# Patient Record
Sex: Female | Born: 1975 | Race: White | Hispanic: No | Smoking: Never smoker
Health system: Southern US, Community
[De-identification: ages and names within clinical notes are randomized; demographics above are authoritative.]

## PROBLEM LIST (undated history)

## (undated) DIAGNOSIS — Z8041 Family history of malignant neoplasm of ovary: Secondary | ICD-10-CM

## (undated) DIAGNOSIS — E7800 Pure hypercholesterolemia, unspecified: Secondary | ICD-10-CM

## (undated) DIAGNOSIS — R569 Unspecified convulsions: Secondary | ICD-10-CM

## (undated) DIAGNOSIS — I82409 Acute embolism and thrombosis of unspecified deep veins of unspecified lower extremity: Secondary | ICD-10-CM

## (undated) DIAGNOSIS — I10 Essential (primary) hypertension: Secondary | ICD-10-CM

## (undated) DIAGNOSIS — R519 Headache, unspecified: Secondary | ICD-10-CM

## (undated) DIAGNOSIS — Z8489 Family history of other specified conditions: Secondary | ICD-10-CM

## (undated) DIAGNOSIS — R51 Headache: Secondary | ICD-10-CM

## (undated) DIAGNOSIS — Z87442 Personal history of urinary calculi: Secondary | ICD-10-CM

## (undated) DIAGNOSIS — F909 Attention-deficit hyperactivity disorder, unspecified type: Secondary | ICD-10-CM

## (undated) DIAGNOSIS — K219 Gastro-esophageal reflux disease without esophagitis: Secondary | ICD-10-CM

## (undated) HISTORY — PX: LEG SURGERY: SHX1003

## (undated) HISTORY — PX: TONSILLECTOMY: SUR1361

---

## 2002-01-29 HISTORY — PX: CHOLECYSTECTOMY: SHX55

## 2008-12-15 ENCOUNTER — Ambulatory Visit: Payer: Self-pay

## 2009-08-08 ENCOUNTER — Ambulatory Visit: Payer: Self-pay | Admitting: Unknown Physician Specialty

## 2011-11-12 ENCOUNTER — Ambulatory Visit: Payer: Self-pay | Admitting: Internal Medicine

## 2013-01-29 HISTORY — PX: DILATION AND CURETTAGE OF UTERUS: SHX78

## 2013-09-17 ENCOUNTER — Ambulatory Visit: Payer: Self-pay | Admitting: Obstetrics and Gynecology

## 2013-09-17 LAB — HEMOGLOBIN: HGB: 14.2 g/dL (ref 12.0–16.0)

## 2013-09-22 ENCOUNTER — Ambulatory Visit: Payer: Self-pay | Admitting: Obstetrics and Gynecology

## 2013-09-24 LAB — PATHOLOGY REPORT

## 2014-01-29 HISTORY — PX: COLPOSCOPY: SHX161

## 2014-03-30 HISTORY — PX: COLPOSCOPY: SHX161

## 2014-05-22 NOTE — Op Note (Signed)
PATIENT NAME:  Ashley Rose, PARO MR#:  [id number] DATE OF BIRTH:  Dec 01, 1975  DATE OF PROCEDURE:  09/22/2013   PREOPERATIVE DIAGNOSIS: Abnormal uterine bleeding and chronic pelvic pain.   POSTOPERATIVE DIAGNOSIS: Abnormal uterine bleeding and chronic pelvic pain.   OPERATIONS PERFORMED: Hysteroscopy, dilatation and curettage and diagnostic laparoscopy.   ANESTHESIA USED: General.    PRIMARY SURGEON: Stoney Bang. Georgianne Fick, MD  ESTIMATED BLOOD LOSS: Minimal.   OPERATIVE FLUIDS: 700 mL of crystalloid.   URINE OUTPUT: 30 mL of clear urine.   COMPLICATIONS: None.   FINDINGS: Normal intra-abdominal anatomy and normal uterine cavity.   SPECIMENS REMOVED: Endometrial curettings.   PATIENT CONDITION FOLLOWING THE PROCEDURE: Stable.   PROCEDURE IN DETAIL: Risks, benefits, and alternatives of the procedure were discussed with the patient prior to proceeding to the operating room. The patient had had prior normal imaging of her GYN anatomy via transvaginal ultrasound. One other thing  considered in the differential was possible endometriosis, given her pain symptoms. As far as her bleeding goes, she did have a brief cessation on bleeding on Mirena IUD, but opted for removal secondary to pain, and has had continued bleeding over the course of a year on Depo-Provera.   The patient was taken to the operating room, where she was placed under general endotracheal anesthesia. She was positioned in the dorsal lithotomy position utilizing Allen stirrups, prepped and draped in the usual sterile fashion. A timeout was performed. Attention was turned to the patient's pelvis. An operative speculum was used to visualize the anterior lip of the cervix, which was reached was grasped with a single-tooth tenaculum. A Hulka tenaculum was then placed to allow manipulation of the uterus. The single-tooth tenaculum and sterile speculum were removed. The patient's bladder was straight catheterized prior to placement of  the tenaculum, yielding 30 mL of clear urine.   Attention was then turned to the patient's abdomen. The umbilicus was infiltrated with 0.5% Sensorcaine. A stab incision was made at the umbilicus and a 5 mm XL trocar was used to gain direct entry into the peritoneal cavity under visualization. A pneumoperitoneum was establish. A second left 5 mm assistant port was then placed in the left lower quadrant under visualization. Inspection of the abdomen and pelvis revealed normal findings. The ovaries, uterus, and tubes were grossly normal. The anterior and posterior cul-de-sac were free of endometriotic implants. There was no evidence of inguinal hernia. The appendix was retrocecal and was unable to be visualized. The liver edge was normal. The ureters were traced throughout their course and appeared normal in nondilated. At this point,  pneumoperitoneum was evacuated. The trocars were removed. Trocar sites were dressed with Dermabond.   Attention was turned to the patient's pelvis. The Hulka tenaculum was removed. The single-tooth tenaculum was replaced on the cervix, and the cervix was sequentially dilated using Pratt dilators. A hysteroscope was then advanced into the uterine cavity, revealing bilateral tubal ostia to be grossly normal and normal uterine contour with normal appearing endometrium. The hysteroscope was removed. The sharp curettage was performed. The single-tooth tenaculum was removed. Tenaculum sites were noted to be hemostatic.   Sponge, needle, and instrument counts were correct x 2.   The patient tolerated the procedure well and was taken to the recovery room in stable condition.     ____________________________ Stoney Bang. Georgianne Fick, MD ams:MT D: 09/22/2013 17:14:43 ET T: 09/23/2013 05:48:00 ET JOB#: [id number]  cc: Stoney Bang. Georgianne Fick, MD, <Dictator> Dorthula Nettles MD ELECTRONICALLY SIGNED 10/08/2013 16:01

## 2014-07-10 ENCOUNTER — Emergency Department: Payer: Medicaid Other

## 2014-07-10 ENCOUNTER — Encounter: Payer: Self-pay | Admitting: Emergency Medicine

## 2014-07-10 ENCOUNTER — Emergency Department
Admission: EM | Admit: 2014-07-10 | Discharge: 2014-07-10 | Disposition: A | Discharge status: Home / Self Care | Payer: Medicaid Other | Attending: Emergency Medicine | Admitting: Emergency Medicine

## 2014-07-10 DIAGNOSIS — S40021A Contusion of right upper arm, initial encounter: Secondary | ICD-10-CM | POA: Diagnosis not present

## 2014-07-10 DIAGNOSIS — S8012XA Contusion of left lower leg, initial encounter: Secondary | ICD-10-CM | POA: Diagnosis not present

## 2014-07-10 DIAGNOSIS — S40022A Contusion of left upper arm, initial encounter: Secondary | ICD-10-CM | POA: Insufficient documentation

## 2014-07-10 DIAGNOSIS — Y9389 Activity, other specified: Secondary | ICD-10-CM | POA: Insufficient documentation

## 2014-07-10 DIAGNOSIS — Z79899 Other long term (current) drug therapy: Secondary | ICD-10-CM | POA: Diagnosis not present

## 2014-07-10 DIAGNOSIS — S0993XA Unspecified injury of face, initial encounter: Secondary | ICD-10-CM | POA: Diagnosis present

## 2014-07-10 DIAGNOSIS — Y9289 Other specified places as the place of occurrence of the external cause: Secondary | ICD-10-CM | POA: Insufficient documentation

## 2014-07-10 DIAGNOSIS — S0083XA Contusion of other part of head, initial encounter: Secondary | ICD-10-CM | POA: Diagnosis not present

## 2014-07-10 DIAGNOSIS — S8011XA Contusion of right lower leg, initial encounter: Secondary | ICD-10-CM | POA: Diagnosis not present

## 2014-07-10 DIAGNOSIS — S7011XA Contusion of right thigh, initial encounter: Secondary | ICD-10-CM | POA: Diagnosis not present

## 2014-07-10 DIAGNOSIS — Z87891 Personal history of nicotine dependence: Secondary | ICD-10-CM | POA: Insufficient documentation

## 2014-07-10 DIAGNOSIS — Y998 Other external cause status: Secondary | ICD-10-CM | POA: Insufficient documentation

## 2014-07-10 HISTORY — DX: Acute embolism and thrombosis of unspecified deep veins of unspecified lower extremity: I82.409

## 2014-07-10 HISTORY — DX: Attention-deficit hyperactivity disorder, unspecified type: F90.9

## 2014-07-10 HISTORY — DX: Pure hypercholesterolemia, unspecified: E78.00

## 2014-07-10 HISTORY — DX: Gastro-esophageal reflux disease without esophagitis: K21.9

## 2014-07-10 MED ORDER — HYDROCODONE-ACETAMINOPHEN 5-325 MG PO TABS
1.0000 | ORAL_TABLET | Freq: Once | ORAL | Status: AC
  Administered 2014-07-10: 1 via ORAL

## 2014-07-10 MED ORDER — HYDROCODONE-ACETAMINOPHEN 5-325 MG PO TABS
ORAL_TABLET | ORAL | Status: AC
  Filled 2014-07-10: qty 1

## 2014-07-10 MED ORDER — NAPROXEN 500 MG PO TABS: 500.0000 mg | ORAL_TABLET | Freq: Two times a day (BID) | ORAL | Status: DC
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
## 2014-07-10 NOTE — Discharge Instructions (Signed)
Assault, General °Assault includes any behavior, whether intentional or reckless, which results in bodily injury to another person and/or damage to property. Included in this would be any behavior, intentional or reckless, that by its nature would be understood (interpreted) by a reasonable person as intent to harm another person or to damage his/her property. Threats may be oral or written. They may be communicated through regular mail, computer, fax, or phone. These threats may be direct or implied. °FORMS OF ASSAULT INCLUDE: °· Physically assaulting a person. This includes physical threats to inflict physical harm as well as: °¨ Slapping. °¨ Hitting. °¨ Poking. °¨ Kicking. °¨ Punching. °¨ Pushing. °· Arson. °· Sabotage. °· Equipment vandalism. °· Damaging or destroying property. °· Throwing or hitting objects. °· Displaying a weapon or an object that appears to be a weapon in a threatening manner. °¨ Carrying a firearm of any kind. °¨ Using a weapon to harm someone. °· Using greater physical size/strength to intimidate another. °¨ Making intimidating or threatening gestures. °¨ Bullying. °¨ Hazing. °· Intimidating, threatening, hostile, or abusive language directed toward another person. °¨ It communicates the intention to engage in violence against that person. And it leads a reasonable person to expect that violent behavior may occur. °· Stalking another person. °IF IT HAPPENS AGAIN: °· Immediately call for emergency help (911 in [place] °· If someone poses clear and immediate danger to you, seek legal authorities to have a protective or restraining order put in place. °· Less threatening assaults can at least be reported to authorities. °STEPS TO TAKE IF A SEXUAL ASSAULT HAS HAPPENED °· Go to an area of safety. This may include a shelter or staying with a friend. Stay away from the area where you have been attacked. A large percentage of sexual assaults are caused by a friend, relative or associate. °· If  medications were given by your caregiver, take them as directed for the full length of time prescribed. °· Only take over-the-counter or prescription medicines for pain, discomfort, or fever as directed by your caregiver. °· If you have come in contact with a sexual disease, find out if you are to be tested again. If your caregiver is concerned about the HIV/AIDS virus, he/she may require you to have continued testing for several months. °· For the protection of your privacy, test results can not be given over the phone. Make sure you receive the results of your test. If your test results are not back during your visit, make an appointment with your caregiver to find out the results. Do not assume everything is normal if you have not heard from your caregiver or the medical facility. It is important for you to follow up on all of your test results. °· File appropriate papers with authorities. This is important in all assaults, even if it has occurred in a family or by a friend. °SEEK MEDICAL CARE IF: °· You have new problems because of your injuries. °· You have problems that may be because of the medicine you are taking, such as: °¨ Rash. °¨ Itching. °¨ Swelling. °¨ Trouble breathing. °· You develop belly (abdominal) pain, feel sick to your stomach (nausea) or are vomiting. °· You begin to run a temperature. °· You need supportive care or referral to a rape crisis center. These are centers with trained personnel who can help you get through this ordeal. °SEEK IMMEDIATE MEDICAL CARE IF: °· You are afraid of being threatened, beaten, or abused. In [place] call 911. °· You   receive new injuries related to abuse.  You develop severe pain in any area injured in the assault or have any change in your condition that concerns you.  You faint or lose consciousness.  You develop chest pain or shortness of breath. Document Released: 01/15/2005 Document Revised: 04/09/2011 Document Reviewed: 09/03/2007 [facility] Patient  Information 2015 [place], [place]. This information is not intended to replace advice given to you by your health care provider. Make sure you discuss any questions you have with your health care provider.  Contusion A contusion is a deep bruise. Contusions happen when an injury causes bleeding under the skin. Signs of bruising include pain, puffiness (swelling), and discolored skin. The contusion may turn blue, purple, or yellow. HOME CARE   Put ice on the injured area.  Put ice in a plastic bag.  Place a towel between your skin and the bag.  Leave the ice on for 15-20 minutes, 03-04 times a day.  Only take medicine as told by your doctor.  Rest the injured area.  If possible, raise (elevate) the injured area to lessen puffiness. GET HELP RIGHT AWAY IF:   You have more bruising or puffiness.  You have pain that is getting worse.  Your puffiness or pain is not helped by medicine. MAKE SURE YOU:   Understand these instructions.  Will watch your condition.  Will get help right away if you are not doing well or get worse. Document Released: 07/04/2007 Document Revised: 04/09/2011 Document Reviewed: 11/20/2010 [facility] Patient Information 2015 [place], [place]. This information is not intended to replace advice given to you by your health care provider. Make sure you discuss any questions you have with your health care provider.
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
## 2014-11-04 ENCOUNTER — Ambulatory Visit: Payer: Medicaid Other | Attending: Neurosurgery | Admitting: Physical Therapy

## 2014-11-04 ENCOUNTER — Encounter: Payer: Self-pay | Admitting: Physical Therapy

## 2014-11-04 DIAGNOSIS — M2569 Stiffness of other specified joint, not elsewhere classified: Secondary | ICD-10-CM

## 2014-11-04 DIAGNOSIS — R29898 Other symptoms and signs involving the musculoskeletal system: Secondary | ICD-10-CM | POA: Diagnosis present

## 2014-11-04 DIAGNOSIS — M545 Low back pain, unspecified: Secondary | ICD-10-CM

## 2014-11-04 DIAGNOSIS — R262 Difficulty in walking, not elsewhere classified: Secondary | ICD-10-CM | POA: Diagnosis present

## 2014-11-04 DIAGNOSIS — M2560 Stiffness of unspecified joint, not elsewhere classified: Secondary | ICD-10-CM
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
## 2015-02-28 ENCOUNTER — Emergency Department
Admission: EM | Admit: 2015-02-28 | Discharge: 2015-02-28 | Disposition: A | Discharge status: Home / Self Care | Payer: Medicaid Other | Attending: Emergency Medicine | Admitting: Emergency Medicine

## 2015-02-28 ENCOUNTER — Encounter: Payer: Self-pay | Admitting: Emergency Medicine

## 2015-02-28 DIAGNOSIS — Z79899 Other long term (current) drug therapy: Secondary | ICD-10-CM | POA: Insufficient documentation

## 2015-02-28 DIAGNOSIS — H6092 Unspecified otitis externa, left ear: Secondary | ICD-10-CM | POA: Insufficient documentation

## 2015-02-28 DIAGNOSIS — Z791 Long term (current) use of non-steroidal anti-inflammatories (NSAID): Secondary | ICD-10-CM | POA: Diagnosis not present

## 2015-02-28 DIAGNOSIS — H9202 Otalgia, left ear: Secondary | ICD-10-CM | POA: Diagnosis present

## 2015-02-28 MED ORDER — IBUPROFEN 800 MG PO TABS: 800.0000 mg | ORAL_TABLET | Freq: Three times a day (TID) | ORAL | Status: DC | PRN

## 2015-02-28 MED ORDER — OFLOXACIN 0.3 % OP SOLN: 4.0000 [drp] | Freq: Two times a day (BID) | OPHTHALMIC | Status: DC

## 2015-02-28 NOTE — Discharge Instructions (Signed)
Ear Drops, Adult You have been diagnosed with a condition requiring you to put drops of medicine into your outer ear. HOME CARE INSTRUCTIONS   Put drops in the affected ear as instructed. After putting the drops in, you will need to lie down with the affected ear facing up for ten minutes so the drops will remain in the ear canal and run down and fill the canal. Continue using the ear drops for as long as directed by your health care provider.  Prior to getting up, put a cotton ball gently in your ear canal. Leave enough of the cotton ball out so it can be easily removed. Do not attempt to push this down into the canal with a cotton-tipped swab or other instrument.  Do not irrigate or wash out your ears if you have had a perforated eardrum or mastoid surgery, or unless instructed to do so by your health care provider.  Keep appointments with your health care provider as instructed.  Finish all medicine, or use for the length of time prescribed by your health care provider. Continue the drops even if your problem seems to be doing well after a couple days, or continue as instructed. SEEK MEDICAL CARE IF:  You become worse or develop increasing pain.  You notice any unusual drainage from your ear (particularly if the drainage has a bad smell).  You develop hearing difficulties.  You experience a serious form of dizziness in which you feel as if the room is spinning, and you feel nauseated (vertigo).  The outside of your ear becomes red or swollen or both. This may be a sign of an allergic reaction. MAKE SURE YOU:   Understand these instructions.  Will watch your condition.  Will get help right away if you are not doing well or get worse.   This information is not intended to replace advice given to you by your health care provider. Make sure you discuss any questions you have with your health care provider.   Document Released: 01/09/2001 Document Revised: 02/05/2014 Document  Reviewed: 08/12/2012 Elsevier Interactive Patient Education 2016 Elsevier Inc.  Otitis Externa Otitis externa is a germ infection in the outer ear. The outer ear is the area from the eardrum to the outside of the ear. Otitis externa is sometimes called "swimmer's ear." HOME CARE  Put drops in the ear as told by your doctor.  Only take medicine as told by your doctor.  If you have diabetes, your doctor may give you more directions. Follow your doctor's directions.  Keep all doctor visits as told. To avoid another infection:  Keep your ear dry. Use the corner of a towel to dry your ear after swimming or bathing.  Avoid scratching or putting things inside your ear.  Avoid swimming in lakes, dirty water, or pools that use a chemical called chlorine poorly.  You may use ear drops after swimming. Combine equal amounts of white vinegar and alcohol in a bottle. Put 3 or 4 drops in each ear. GET HELP IF:   You have a fever.  Your ear is still red, puffy (swollen), or painful after 3 days.  You still have yellowish-white fluid (pus) coming from the ear after 3 days.  Your redness, puffiness, or pain gets worse.  You have a really bad headache.  You have redness, puffiness, pain, or tenderness behind your ear. MAKE SURE YOU:   Understand these instructions.  Will watch your condition.  Will get help right away if you  are not doing well or get worse.   This information is not intended to replace advice given to you by your health care provider. Make sure you discuss any questions you have with your health care provider.   Document Released: 07/04/2007 Document Revised: 02/05/2014 Document Reviewed: 02/01/2011 Elsevier Interactive Patient Education [employer].

## 2015-02-28 NOTE — ED Notes (Signed)
Left ear pain for about 1 week  Tried to "pop" her ear w/o any relief   No fever

## 2015-02-28 NOTE — ED Provider Notes (Signed)
[facility] Emergency Department Provider Note  ____________________________________________  Time seen: Approximately 11:13 AM  I have reviewed the triage vital signs and the nursing notes.   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 female complains of left ear pain 2 weeks. Patient states that her ears. Had been filled with fluid and unable to pop the left ear. No relief with any over-the-counter medications.   Past Medical History  Diagnosis Date  . DVT (deep venous thrombosis) (HCC)     Right leg  . Hypercholesteremia   . GERD (gastroesophageal reflux disease)   . ADHD (attention deficit hyperactivity disorder)     There are no active problems to display for this patient.   History reviewed. No pertinent past surgical history.  Current Outpatient Rx  Name  Route  Sig  Dispense  Refill  . amphetamine-dextroamphetamine (ADDERALL) 15 MG tablet   Oral   Take 15 mg by mouth 2 (two) times daily.         . cetirizine (ZYRTEC) 10 MG tablet   Oral   Take 10 mg by mouth daily.         Marland Kitchen gemfibrozil (LOPID) 600 MG tablet   Oral   Take 600 mg by mouth 2 (two) times daily before a meal.         . ibuprofen (ADVIL,MOTRIN) 800 MG tablet   Oral   Take 1 tablet (800 mg total) by mouth every 8 (eight) hours as needed.   30 tablet   0   . naproxen (NAPROSYN) 500 MG tablet   Oral   Take 1 tablet (500 mg total) by mouth 2 (two) times daily with a meal.   20 tablet   2   . ofloxacin (OCUFLOX) 0.3 % ophthalmic solution   Left Ear   Place 4 drops into the left ear 2 (two) times daily.   5 mL   0   . omeprazole (PRILOSEC) 20 MG capsule   Oral   Take 20 mg by mouth daily.         . rivaroxaban (XARELTO) 20 MG TABS tablet   Oral   Take 20 mg by mouth daily with supper.           Allergies Review of patient's allergies indicates no known allergies.  History reviewed. No pertinent family  history.  Social History Social History  Substance Use Topics  . Smoking status: Never Smoker   . Smokeless tobacco: None  . Alcohol Use: Yes     Comment: occasionaly    Review of Systems Constitutional: No fever/chills Eyes: No visual changes. ENT: Positive left ear pain. Cardiovascular: Denies chest pain. Respiratory: Denies shortness of breath. Gastrointestinal: No abdominal pain.  No nausea, no vomiting.  No diarrhea.  No constipation. Genitourinary: Negative for dysuria. Musculoskeletal: Negative for back pain. Skin: Negative for rash. Neurological: Negative for headaches, focal weakness or numbness.  10-point ROS otherwise negative.  ____________________________________________   PHYSICAL EXAM:  VITAL SIGNS: ED Triage Vitals  Enc Vitals Group     BP 02/28/15 1025 144/73 mmHg     Pulse Rate 02/28/15 1025 106     Resp 02/28/15 1025 18     Temp 02/28/15 1025 98 F (36.7 C)     Temp Source 02/28/15 1025 Oral     SpO2 02/28/15 1025 97 %     Weight 02/28/15 1025 210 lb (95.255 kg)     Height 02/28/15  1025 5\' 3"  (1.6 m)     Head Cir --      Peak Flow --      Pain Score 02/28/15 1026 6     Pain Loc --      Pain Edu? --      Excl. in [place]? --     Constitutional: Alert and oriented. Well appearing and in no acute distress. Eyes: Conjunctivae are normal. PERRL. EOMI. Head: Atraumatic. Right TM unremarkable left TM obscured with greenish discharge. No tragal tenderness. Nose: No congestion/rhinnorhea. Mouth/Throat: Mucous membranes are moist.  Oropharynx non-erythematous. Neck: No stridor.   Cardiovascular: Normal rate, regular rhythm. Grossly normal heart sounds.  Good peripheral circulation. Respiratory: Normal respiratory effort.  No retractions. Lungs CTAB. Musculoskeletal: No lower extremity tenderness nor edema.  No joint effusions. Neurologic:  Normal speech and language. No gross focal neurologic deficits are appreciated. No gait instability. Skin:  Skin is  warm, dry and intact. No rash noted. Psychiatric: Mood and affect are normal. Speech and behavior are normal.  ____________________________________________   LABS (all labs ordered are listed, but only abnormal results are displayed)  Labs Reviewed - No data to display ____________________________________________   PROCEDURES  Procedure(s) performed: None  Critical Care performed: No  ____________________________________________   INITIAL IMPRESSION / ASSESSMENT AND PLAN / ED COURSE  Pertinent labs & imaging results that were available during my care of the patient were reviewed by me and considered in my medical decision making (see chart for details).  Acute otitis externa. Rx given for Ocuflox drops and Motrin 800 mg 3 times a day. Patient follow-up PCP or return to the ER with any worsening symptomology. Patient voices no other emergency medical complaints at this time. ____________________________________________   FINAL CLINICAL IMPRESSION(S) / ED DIAGNOSES  Final diagnoses:  Otitis externa, left     This chart was dictated using voice recognition software/Dragon. Despite best efforts to proofread, errors can occur which can change the meaning. Any change was purely unintentional.   Arlyss Repress, PA-C 02/28/15 [place], MD 02/28/15 [phone]

## 2015-02-28 NOTE — ED Notes (Signed)
Pt c/o left ear pain for 2 weeks now, states her left ear began draining blood last Thursday, was staying at home to try "home remedies" first.

## 2016-09-06 ENCOUNTER — Other Ambulatory Visit: Payer: Self-pay | Admitting: Nurse Practitioner

## 2016-09-06 DIAGNOSIS — Z1231 Encounter for screening mammogram for malignant neoplasm of breast: Secondary | ICD-10-CM

## 2016-09-25 ENCOUNTER — Ambulatory Visit
Admission: RE | Admit: 2016-09-25 | Discharge: 2016-09-25 | Disposition: A | Discharge status: Home / Self Care | Payer: Medicaid Other | Source: Ambulatory Visit | Attending: Nurse Practitioner | Admitting: Nurse Practitioner

## 2016-09-25 DIAGNOSIS — Z1231 Encounter for screening mammogram for malignant neoplasm of breast: Secondary | ICD-10-CM | POA: Insufficient documentation

## 2016-12-11 ENCOUNTER — Telehealth: Payer: Self-pay | Admitting: Obstetrics & Gynecology

## 2016-12-11 NOTE — Telephone Encounter (Signed)
[facility] referring  Feels hormonal imbalance, has heavy and light mentrual cycles reports eating more with cycles, patient does have IUD. Called and lvm for pt to call back to be schedule

## 2016-12-25 ENCOUNTER — Ambulatory Visit: Payer: Medicaid Other | Admitting: Obstetrics and Gynecology

## 2016-12-25 ENCOUNTER — Encounter: Payer: Self-pay | Admitting: Obstetrics and Gynecology

## 2016-12-25 VITALS — BP 148/98 | HR 122 | Ht 63.0 in | Wt 210.0 lb

## 2016-12-25 DIAGNOSIS — T8389XA Other specified complication of genitourinary prosthetic devices, implants and grafts, initial encounter: Secondary | ICD-10-CM | POA: Diagnosis not present

## 2016-12-25 DIAGNOSIS — N920 Excessive and frequent menstruation with regular cycle: Secondary | ICD-10-CM

## 2016-12-25 DIAGNOSIS — Z113 Encounter for screening for infections with a predominantly sexual mode of transmission: Secondary | ICD-10-CM | POA: Diagnosis not present

## 2016-12-25 DIAGNOSIS — Z124 Encounter for screening for malignant neoplasm of cervix: Secondary | ICD-10-CM | POA: Diagnosis not present

## 2016-12-25 NOTE — Progress Notes (Signed)
Gynecology Abnormal Uterine Bleeding Initial Evaluation   Chief Complaint:  Chief Complaint  Patient presents with  . Menstrual Problem    History of Present Illness:    Paitient is a 41 y.o. G1P1001 who LMP was Patient's last menstrual period was 12/17/2016., presents today for a problem visit.  She complains of menorrhagia that  began several years ago and its severity is described as moderate.  She has irregular periods from 2-6 weeks lasting 3-14 days and they are associated with moderate menstrual cramping at times.  She has used the following for attempts at control: maxi pad.  Previous evaluation: Hysteroscopy D&C 09/23/2013 secretory endometrium LSIL pap 2016 with colposcopy 04/21/14 normal, normal ultrasound  Prior Diagnosis: abnormal uterine bleeding, likely adenomyosis Previous Treatment: OCP, Mirena, depo provera  Current symptoms are moderately distressing to the patient and interfering on a regular basis with her daily activities.  LMP: Patient's last menstrual period was 12/17/2016.  Review of Systems: Review of Systems  Constitutional: Negative for chills and fever.  Respiratory: Negative for shortness of breath.   Cardiovascular: Negative for chest pain.  Gastrointestinal: Positive for abdominal pain.  Endo/Heme/Allergies: Does not bruise/bleed easily.    Past Medical History:  Past Medical History:  Diagnosis Date  . ADHD (attention deficit hyperactivity disorder)   . DVT (deep venous thrombosis) (HCC)    Right leg  . GERD (gastroesophageal reflux disease)   . Hypercholesteremia     Past Surgical History:  History reviewed. No pertinent surgical history.  Obstetric History: G1P1001  Family History:  History reviewed. No pertinent family history.  Social History:  Social History   Socioeconomic History  . Marital status: Married    Spouse name: Not on file  . Number of children: Not on file  . Years of education: Not on file  . Highest  education level: Not on file  Social Needs  . Financial resource strain: Not on file  . Food insecurity - worry: Not on file  . Food insecurity - inability: Not on file  . Transportation needs - medical: Not on file  . Transportation needs - non-medical: Not on file  Occupational History  . Not on file  Tobacco Use  . Smoking status: Never Smoker  . Smokeless tobacco: Never Used  Substance and Sexual Activity  . Alcohol use: Yes    Comment: occasionaly  . Drug use: No  . Sexual activity: Not Currently    Birth control/protection: IUD  Other Topics Concern  . Not on file  Social History Narrative  . Not on file    Allergies:  No Known Allergies  Medications: Prior to Admission medications   Medication Sig Start Date End Date Taking? Authorizing Provider  simvastatin (ZOCOR) 40 MG tablet Take by mouth. 11/09/14  Yes [provider]  SUMAtriptan (IMITREX) 100 MG tablet Take 100 mg by mouth every 2 (two) hours as needed for migraine. May repeat in 2 hours if headache persists or recurs.   Yes [provider]  amphetamine-dextroamphetamine (ADDERALL) 15 MG tablet Take 15 mg by mouth 2 (two) times daily.    [provider]  cetirizine (ZYRTEC) 10 MG tablet Take 10 mg by mouth daily.    [provider]  metoprolol tartrate (LOPRESSOR) 25 MG tablet TAKE 1 TABLET BY MOUTH TWICE DAILY (STOP METOPROLOL SUCCINATE ER 25 MG DAILY) 09/30/16   [provider]  naproxen (NAPROSYN) 500 MG tablet Take 1 tablet (500 mg total) by mouth 2 (two)  times daily with a meal. 07/10/14   Lavonia Drafts, MD  omeprazole (PRILOSEC) 20 MG capsule Take 20 mg by mouth daily.    [provider]    Physical Exam Vitals:  Vitals:   12/25/16 1320  BP: (!) 148/98  Pulse: (!) 122   Patient's last menstrual period was 12/17/2016.  General: NAD HEENT: normocephalic, anicteric Thyroid: no enlargement, no palpable nodules Pulmonary: No increased work of  breathing Abdomen: NABS, soft, non-tender, non-distended.  Umbilicus without lesions.  No hepatomegaly, splenomegaly or masses palpable. No evidence of hernia  Genitourinary:  External: Normal external female genitalia.  Normal urethral meatus, normal  Bartholin's and Skene's glands.    Vagina: Normal vaginal mucosa, no evidence of prolapse.    Cervix: Grossly normal in appearance, no bleeding  Uterus: Non-enlarged, mobile, normal contour.  No CMT, IUD strings visualized  Adnexa: ovaries non-enlarged, no adnexal masses  Rectal: deferred  Lymphatic: no evidence of inguinal lymphadenopathy Extremities: no edema, erythema, or tenderness Neurologic: Grossly intact Psychiatric: mood appropriate, affect full  Female chaperone present for pelvic portions of the physical exam  Assessment: 41 y.o. G1P1001 with abnormal uterine bleeding  Plan: Problem List Items Addressed This Visit    None    Visit Diagnoses    Menorrhagia due to intrauterine device (IUD) ([place])    -  Primary   Relevant Orders   CBC   Prolactin   TSH   US PELVIS TRANSVANGINAL NON-OB (TV ONLY)   Cervical cancer screening       Relevant Orders   PapIG, CtNgTv, HPV, rfx 16/18   Screening examination for STD (sexually transmitted disease)       Relevant Orders   PapIG, CtNgTv, HPV, rfx 16/18       1) Discussed management options for abnormal uterine bleeding including expectant, NSAIDs, tranexamic acid (Lysteda), oral progesterone (Provera, norethindrone, megace), Depo Provera, Levonorgestrel containing IUD, endometrial ablation (Novasure) or hysterectomy as definitive surgical management.  Discussed risks and benefits of each method.   Final management decision will hinge on results of patient's work up and whether an underlying etiology for the patients bleeding symptoms can be discerned.  We will conduct a basic work up examining using the PALM-COIEN classification system. .  Bleeding precautions reviewed.  - CBC -  Ultrasound to evaluate Mirena IUD placed 11/26/13 - overdue for follow up pap obtained today - Has failed OCP, Mirena.  Remaining management option include lysteda, ablation, and hysterectomy  2) A total of 15 minutes were spent in face-to-face contact with the patient during this encounter with over half of that time devoted to counseling and coordination of care.

## 2016-12-26 LAB — CBC
Hematocrit: 40.9 % (ref 34.0–46.6)
Hemoglobin: 13.4 g/dL (ref 11.1–15.9)
MCH: 29.4 pg (ref 26.6–33.0)
MCHC: 32.8 g/dL (ref 31.5–35.7)
MCV: 90 fL (ref 79–97)
PLATELETS: 261 10*3/uL (ref 150–379)
RBC: 4.56 x10E6/uL (ref 3.77–5.28)
RDW: 13.2 % (ref 12.3–15.4)
WBC: 8.8 10*3/uL (ref 3.4–10.8)

## 2016-12-26 LAB — TSH: TSH: 2.37 u[IU]/mL (ref 0.450–4.500)

## 2016-12-26 LAB — PROLACTIN: Prolactin: 14.6 ng/mL (ref 4.8–23.3)

## 2016-12-28 ENCOUNTER — Encounter: Payer: Self-pay | Admitting: Obstetrics and Gynecology

## 2016-12-28 LAB — PAPIG, CTNGTV, HPV, RFX 16/18
Chlamydia, Nuc. Acid Amp: NEGATIVE
Gonococcus, Nuc. Acid Amp: NEGATIVE
HPV, high-risk: NEGATIVE
PAP Smear Comment: 0
Trich vag by NAA: NEGATIVE

## 2017-01-07 ENCOUNTER — Ambulatory Visit: Payer: Medicaid Other | Admitting: Obstetrics and Gynecology

## 2017-01-07 ENCOUNTER — Other Ambulatory Visit: Payer: Medicaid Other

## 2017-01-16 ENCOUNTER — Ambulatory Visit (INDEPENDENT_AMBULATORY_CARE_PROVIDER_SITE_OTHER): Payer: Medicaid Other

## 2017-01-16 ENCOUNTER — Ambulatory Visit: Payer: Medicaid Other | Admitting: Obstetrics and Gynecology

## 2017-01-16 ENCOUNTER — Encounter: Payer: Self-pay | Admitting: Obstetrics and Gynecology

## 2017-01-16 VITALS — BP 114/84 | HR 84 | Wt 205.0 lb

## 2017-01-16 DIAGNOSIS — N939 Abnormal uterine and vaginal bleeding, unspecified: Secondary | ICD-10-CM | POA: Diagnosis not present

## 2017-01-16 DIAGNOSIS — N920 Excessive and frequent menstruation with regular cycle: Secondary | ICD-10-CM | POA: Diagnosis not present

## 2017-01-16 DIAGNOSIS — T8389XA Other specified complication of genitourinary prosthetic devices, implants and grafts, initial encounter: Secondary | ICD-10-CM

## 2017-01-16 NOTE — Progress Notes (Signed)
Gynecology Ultrasound Follow Up  Chief Complaint:  Chief Complaint  Patient presents with  . U/S follow up     History of Present Illness: Patient is a 41 y.o. female who presents today for ultrasound evaluation of abnormal uterine bleeding.  Ultrasound demonstrates the following findgins Adnexa: no masses seen normal consistency Uterus: Non-enlarged, no evidence of fibroids, IUD in proper location with endometrial stripe of 3.76mm Additional: no free fluid  Review of Systems: Review of Systems  Constitutional: Negative for chills, fever and weight loss.  Gastrointestinal: Positive for abdominal pain. Negative for constipation and diarrhea.  Genitourinary: Negative for dysuria, frequency and urgency.  Neurological: Negative for dizziness and headaches.  Endo/Heme/Allergies: Does not bruise/bleed easily.    Past Medical History:  Past Medical History:  Diagnosis Date  . ADHD (attention deficit hyperactivity disorder)   . DVT (deep venous thrombosis) (HCC)    Right leg  . GERD (gastroesophageal reflux disease)   . Hypercholesteremia     Past Surgical History:  No past surgical history on file.  Gynecologic History:  Patient's last menstrual period was 12/17/2016. Contraception: IUD Mirena placed 11/26/2013 Last Pap: 12/25/2016 Results were: .NIL HPV negative  Family History:  No family history on file.  Social History:  Social History   Socioeconomic History  . Marital status: Married    Spouse name: Not on file  . Number of children: Not on file  . Years of education: Not on file  . Highest education level: Not on file  Social Needs  . Financial resource strain: Not on file  . Food insecurity - worry: Not on file  . Food insecurity - inability: Not on file  . Transportation needs - medical: Not on file  . Transportation needs - non-medical: Not on file  Occupational History  . Not on file  Tobacco Use  . Smoking status: Never Smoker  . Smokeless  tobacco: Never Used  Substance and Sexual Activity  . Alcohol use: Yes    Comment: occasionaly  . Drug use: No  . Sexual activity: Not Currently    Birth control/protection: IUD  Other Topics Concern  . Not on file  Social History Narrative  . Not on file    Allergies:  No Known Allergies  Medications: Prior to Admission medications   Medication Sig Start Date End Date Taking? Authorizing Provider  amphetamine-dextroamphetamine (ADDERALL) 15 MG tablet Take 15 mg by mouth 2 (two) times daily.   Yes [provider]  cetirizine (ZYRTEC) 10 MG tablet Take 10 mg by mouth daily.   Yes [provider]  metoprolol tartrate (LOPRESSOR) 25 MG tablet TAKE 1 TABLET BY MOUTH TWICE DAILY (STOP METOPROLOL SUCCINATE ER 25 MG DAILY) 09/30/16  Yes [provider]  naproxen (NAPROSYN) 500 MG tablet Take 1 tablet (500 mg total) by mouth 2 (two) times daily with a meal. 07/10/14  Yes Lavonia Drafts, MD  omeprazole (PRILOSEC) 20 MG capsule Take 20 mg by mouth daily.   Yes [provider]  simvastatin (ZOCOR) 40 MG tablet Take by mouth. 11/09/14  Yes [provider]  SUMAtriptan (IMITREX) 100 MG tablet Take 100 mg by mouth every 2 (two) hours as needed for migraine. May repeat in 2 hours if headache persists or recurs.   Yes [provider]    Physical Exam Vitals: Blood pressure 114/84, pulse 84, weight 205 lb (93 kg), last menstrual period 12/17/2016.  General: NAD HEENT: normocephalic, anicteric Pulmonary: No increased work of breathing Extremities:  no edema, erythema, or tenderness Neurologic: Grossly intact, normal gait Psychiatric: mood appropriate, affect full   Assessment: 41 y.o. G1P1001 No problem-specific Assessment & Plan notes found for this encounter.   Plan: Problem List Items Addressed This Visit    None      1) Patient opts for definitive surgical management via hysterectomy. The risks of surgery were discussed in detail  with the patient including but not limited to: bleeding which may require transfusion or reoperation; infection which may require antibiotics; injury to bowel, bladder, ureters or other surrounding organs (With a literature reported rate of urinary tract injury of 1% quoted); need for additional procedures including laparotomy; thromboembolic phenomenon, incisional problems and other postoperative/anesthesia complications.  Patient was also advised that recovery procedure generally involves an overnight stay; and the  expected recovery time after a hysterectomy being in the range of 6-8 weeks.  Likelihood of success in alleviating the patient's symptoms was discussed.  While definitive in regards to issues with menstural bleeding, pelvic pain if present preoperatively may continue and in fact worsen postoperatively.  She is aware that the procedure will render her unable to pursue childbearing in the future.   She was told that she will be contacted by our surgical scheduler regarding the time and date of her surgery; routine preoperative instructions of having nothing to eat or drink after midnight on the day prior to surgery and also coming to the hospital 1.5 hours prior to her time of surgery were also emphasized.  She was told she may be called for a preoperative appointment about a week prior to surgery and will be given further preoperative instructions at that visit.  Routine postoperative instructions will be reviewed with the patient and her family in detail after surgery. Printed patient education handouts about the procedure was given to the patient to review at home.   The patient has now failed OCP's, depo provera, and Mirena IUD (11/26/2013), and NSAID's for management of her abnormal uterine bleeding and dysmenorrhea.    Pap NIL HPV negative 12/25/2016.  She is not currently anemic with last H&H 13/4 and 40.9 on 12/25/2016  A total of 15 minutes were spent in face-to-face contact with the  patient during this encounter with over half of that time devoted to counseling and coordination of care.

## 2017-01-18 ENCOUNTER — Telehealth: Payer: Self-pay | Admitting: Obstetrics and Gynecology

## 2017-01-18 NOTE — Telephone Encounter (Signed)
-----   Message from Malachy Mood, MD sent at 01/17/2017  5:20 PM EST ----- Regarding: Surgery Surgery Date: 2-8 weeks  LOS: observation  Surgery Booking Request Patient Full Name: Ashley Rose MRN: [id number]  DOB: 1975/08/20  Surgeon: Malachy Mood, MD  Requested Surgery Date and Time: 2-8 weeks Primary Diagnosis and Code: Abnormal uterine bleeding Secondary Diagnosis and Code: Dysmenorrhea Surgical Procedure: TLH/BS and cystoscopy L&D Notification:N/A Admission Status: observation Length of Surgery: 2hrs Special Case Needs: none H&P:  Phone Interview or Office Pre-Admit: preadmit Interpreter: none Language: English Medical Clearance: none Special Scheduling Instructions: none

## 2017-01-18 NOTE — Telephone Encounter (Signed)
Lmtrc

## 2017-01-18 NOTE — Telephone Encounter (Signed)
Patient returned the call and was given available OR dates. Patient wanted to schedule surgery on 02/14/17 but has a dental appt on 1/22 and wanted to know if it was ok to keep that appt. Per Dr. Georgianne Fick, she should be able to go to the dental appt as long as someone drives her. Patient is aware of H&P on 02/04/17 @ 9:50am at [facility] w/ Dr. Georgianne Fick, Pre-admit Testing afterwards, and OR on 02/14/17. Ext given.

## 2017-02-04 ENCOUNTER — Other Ambulatory Visit: Payer: Self-pay

## 2017-02-04 ENCOUNTER — Encounter
Admission: RE | Admit: 2017-02-04 | Discharge: 2017-02-04 | Disposition: A | Discharge status: Home / Self Care | Payer: Medicaid Other | Source: Ambulatory Visit | Attending: Obstetrics and Gynecology | Admitting: Obstetrics and Gynecology

## 2017-02-04 ENCOUNTER — Encounter: Payer: Self-pay | Admitting: Obstetrics and Gynecology

## 2017-02-04 ENCOUNTER — Ambulatory Visit (INDEPENDENT_AMBULATORY_CARE_PROVIDER_SITE_OTHER): Payer: Medicaid Other | Admitting: Obstetrics and Gynecology

## 2017-02-04 VITALS — BP 110/76 | HR 72 | Ht 63.0 in | Wt 210.0 lb

## 2017-02-04 DIAGNOSIS — Z01818 Encounter for other preprocedural examination: Secondary | ICD-10-CM

## 2017-02-04 DIAGNOSIS — N921 Excessive and frequent menstruation with irregular cycle: Secondary | ICD-10-CM

## 2017-02-04 DIAGNOSIS — Z01812 Encounter for preprocedural laboratory examination: Secondary | ICD-10-CM | POA: Insufficient documentation

## 2017-02-04 DIAGNOSIS — N946 Dysmenorrhea, unspecified: Secondary | ICD-10-CM

## 2017-02-04 HISTORY — DX: Family history of other specified conditions: Z84.89

## 2017-02-04 HISTORY — DX: Unspecified convulsions: R56.9

## 2017-02-04 HISTORY — DX: Personal history of urinary calculi: Z87.442

## 2017-02-04 HISTORY — DX: Essential (primary) hypertension: I10

## 2017-02-04 LAB — BASIC METABOLIC PANEL
Anion gap: 9 (ref 5–15)
BUN: 11 mg/dL (ref 6–20)
CHLORIDE: 104 mmol/L (ref 101–111)
CO2: 28 mmol/L (ref 22–32)
Calcium: 9.2 mg/dL (ref 8.9–10.3)
Creatinine, Ser: 0.56 mg/dL (ref 0.44–1.00)
GFR calc Af Amer: >60 mL/min (ref >60)
GFR calc non Af Amer: >60 mL/min (ref >60)
GLUCOSE: 100 mg/dL — AB (ref 65–99)
Potassium: 3.4 mmol/L — ABNORMAL LOW (ref 3.5–5.1)
SODIUM: 141 mmol/L (ref 135–145)

## 2017-02-04 LAB — CBC
HCT: 40.9 % (ref 35.0–47.0)
Hemoglobin: 13.6 g/dL (ref 12.0–16.0)
MCH: 30.1 pg (ref 26.0–34.0)
MCHC: 33.3 g/dL (ref 32.0–36.0)
MCV: 90.5 fL (ref 80.0–100.0)
Platelets: 257 10*3/uL (ref 150–440)
RBC: 4.52 MIL/uL (ref 3.80–5.20)
RDW: 13.8 % (ref 11.5–14.5)
WBC: 8.5 10*3/uL (ref 3.6–11.0)

## 2017-02-04 LAB — TYPE AND SCREEN
ABO/RH(D): A POS
Antibody Screen: NEGATIVE

## 2017-02-04 NOTE — Patient Instructions (Signed)
Your procedure is scheduled on: Thursday, February 14, 2017 Report to Same Day Surgery on the 2nd floor in the [facility]. To find out your arrival time, please call [phone] between 1PM - 3PM on: Wednesday, February 13, 2017  REMEMBER: Instructions that are not followed completely may result in serious medical risk, up to and including death; or upon the discretion of your surgeon and anesthesiologist your surgery may need to be rescheduled.  Do not eat food after midnight the night before your procedure.  No gum chewing or hard candies.  You may however, drink CLEAR liquids up to 2 hours before you are scheduled to arrive at the hospital for your procedure.  Do not drink clear liquids within 2 hours of the start of your surgery.  Clear liquids include: - water  - apple juice without pulp - clear gatorade - black coffee or tea (Do NOT add anything to the coffee or tea) Do NOT drink anything that is not on this list.  No Alcohol for 24 hours before or after surgery.  No Smoking including e-cigarettes for 24 hours prior to surgery. No chewable tobacco products for at least 6 hours prior to surgery. No nicotine patches on the day of surgery.  Notify your doctor if there is any change in your medical condition (cold, fever, infection).  Do not wear jewelry, make-up, hairpins, clips or nail polish.  Do not wear lotions, powders, or perfumes. You may wear deodorant.  Do not shave 48 hours prior to surgery.   Contacts and dentures may not be worn into surgery.  Do not bring valuables to the hospital. [facility] is not responsible for any belongings or valuables.   TAKE THESE MEDICATIONS THE MORNING OF SURGERY WITH A SIP OF WATER:  1.  METOPROLOL  Use CHG Soap as directed on instruction sheet.  NOW!  Stop Anti-inflammatories such as Advil, Aleve, Ibuprofen, Motrin, Naproxen, Naprosyn, Goodie powder, or aspirin products. (May take Tylenol or Acetaminophen if needed.)  NOW!   Stop ANY OVER THE COUNTER supplements until after surgery. (May continue Vitamin D.)  If you are being admitted to the hospital overnight, leave your suitcase in the car. After surgery it may be brought to your room.  If you are being discharged the day of surgery, you will not be allowed to drive home. You will need someone to drive you home and stay with you that night.   If you are taking public transportation, you will need to have a responsible adult to with you.  Please call the number above if you have any questions about these instructions.

## 2017-02-04 NOTE — Progress Notes (Signed)
Obstetrics & Gynecology Surgery H&P    Chief Complaint: Scheduled Surgery   History of Present Illness: Patient is a 42 y.o. G1P1001 presenting for scheduled TLH, BS, and cystoscopy, for the treatment or further evaluation of menorrhagia and dysmenorrhea.   Prior Treatments prior to proceeding with surgery include: OCP, Mirena 11/26/2013, depo-provera  Preoperative Pap: 12/25/2016 NIL HPV negative Preoperative Endometrial biopsy: D&C 09/23/13 secretory endometrium and normal cavity  Preoperative Ultrasound: 01/16/2017 Normal   Review of Systems:10 point review of systems  Past Medical History:  Past Medical History:  Diagnosis Date  . ADHD (attention deficit hyperactivity disorder)   . DVT (deep venous thrombosis) (HCC)    Right leg  . GERD (gastroesophageal reflux disease)   . Hypercholesteremia     Past Surgical History:  Past Surgical History:  Procedure Laterality Date  . CHOLECYSTECTOMY  2004  . COLPOSCOPY  01/2014  . COLPOSCOPY  03/2014  . DILATION AND CURETTAGE OF UTERUS  2015  . LEG SURGERY      Family History:  No family history on file.  Social History:  Social History   Socioeconomic History  . Marital status: Married    Spouse name: Not on file  . Number of children: Not on file  . Years of education: Not on file  . Highest education level: Not on file  Social Needs  . Financial resource strain: Not on file  . Food insecurity - worry: Not on file  . Food insecurity - inability: Not on file  . Transportation needs - medical: Not on file  . Transportation needs - non-medical: Not on file  Occupational History  . Not on file  Tobacco Use  . Smoking status: Never Smoker  . Smokeless tobacco: Never Used  Substance and Sexual Activity  . Alcohol use: Yes    Comment: occasionaly  . Drug use: No  . Sexual activity: Not Currently    Birth control/protection: IUD  Other Topics Concern  . Not on file  Social History Narrative  . Not on file     Allergies:  Allergies  Allergen Reactions  . Corn-Containing Products Other (See Comments)    Sinus congestion   . Milk-Related Compounds Other (See Comments)    Sinus congestion   . Penicillins Other (See Comments)    Has patient had a PCN reaction causing immediate rash, facial/tongue/throat swelling, SOB or lightheadedness with hypotension: Unknown Has patient had a PCN reaction causing severe rash involving mucus membranes or skin necrosis: Unknown Has patient had a PCN reaction that required hospitalization: Unknown Has patient had a PCN reaction occurring within the last 10 years: No If all of the above answers are "NO", then may proceed with Cephalosporin use.   . Eggshell Membrane (Chicken)  [Egg Shells] Other (See Comments)    Medications: Prior to Admission medications   Medication Sig Start Date End Date Taking? Authorizing Provider  amphetamine-dextroamphetamine (ADDERALL) 15 MG tablet Take 15 mg by mouth 2 (two) times daily.   Yes [provider]  calcium carbonate (TUMS - DOSED IN MG ELEMENTAL CALCIUM) 500 MG chewable tablet Chew 2 tablets by mouth daily as needed for indigestion or heartburn.   Yes [provider]  cetirizine (ZYRTEC) 10 MG tablet Take 10 mg by mouth daily.   Yes [provider]  cholecalciferol (VITAMIN D) 1000 units tablet Take 1,000 Units by mouth daily.   Yes [provider]  EPINEPHrine 0.3 mg/0.3 mL IJ SOAJ injection Inject 0.3 mg into the  muscle once.   Yes [provider]  metoprolol tartrate (LOPRESSOR) 25 MG tablet TAKE 1 TABLET BY MOUTH TWICE DAILY (STOP METOPROLOL SUCCINATE ER 25 MG DAILY) 09/30/16  Yes [provider]  naproxen (NAPROSYN) 500 MG tablet Take 1 tablet (500 mg total) by mouth 2 (two) times daily with a meal. 07/10/14  Yes Lavonia Drafts, MD  ondansetron (ZOFRAN) 4 MG tablet Take 4 mg by mouth every 8 (eight) hours as needed for nausea or vomiting.   Yes [provider]  simvastatin (ZOCOR) 40 MG tablet Take 40 mg by mouth daily at 6 PM.  11/09/14  Yes [provider]  SUMAtriptan (IMITREX) 25 MG tablet Take 25 mg by mouth every 2 (two) hours as needed for migraine. May repeat in 2 hours if headache persists or recurs.   Yes [provider]    Physical Exam Vitals: Blood pressure 110/76, pulse 72, height 5\' 3"  (1.6 m), weight 210 lb (95.3 kg). General: NAD HEENT: normocephalic, anicteric Pulmonary: No increased work of breathing Cardiovascular: RRR, distal pulses 2+ Extremities: no edema, erythema, or tenderness Neurologic: Grossly intact Psychiatric: mood appropriate, affect full  Imaging US Pelvis Transvanginal Non-ob (tv Only)  Result Date: 01/16/2017 OB/GYN ULTRASOUND: Please refer to the "Notes" tab to see imaging impression.    Assessment: 42 y.o. G1P1001 presenting for scheduled TLH, BS and cystoscopy  Plan: 1) Patient opts for definitive surgical management via hysterectomy. The risks of surgery were discussed in detail with the patient including but not limited to: bleeding which may require transfusion or reoperation; infection which may require antibiotics; injury to bowel, bladder, ureters or other surrounding organs (With a literature reported rate of urinary tract injury of 1% quoted); need for additional procedures including laparotomy; thromboembolic phenomenon, incisional problems and other postoperative/anesthesia complications.  Patient was also advised that recovery procedure generally involves an overnight stay; and the  expected recovery time after a hysterectomy being in the range of 6-8 weeks.  Likelihood of success in alleviating the patient's symptoms was discussed.  While definitive in regards to issues with menstural bleeding, pelvic pain if present preoperatively may continue and in fact worsen postoperatively.  She is aware that the procedure will render her unable to pursue childbearing in the future.    She was told that she will be contacted by our surgical scheduler regarding the time and date of her surgery; routine preoperative instructions of having nothing to eat or drink after midnight on the day prior to surgery and also coming to the hospital 1.5 hours prior to her time of surgery were also emphasized.  She was told she may be called for a preoperative appointment about a week prior to surgery and will be given further preoperative instructions at that visit.  Routine postoperative instructions will be reviewed with the patient and her family in detail after surgery. Printed patient education handouts about the procedure was given to the patient to review at home.  2) Routine postoperative instructions were reviewed with the patient and her family in detail today including the expected length of recovery and likely postoperative course.  The patient concurred with the proposed plan, giving informed written consent for the surgery today.  Patient instructed on the importance of being NPO after midnight prior to her procedure.  If warranted preoperative prophylactic antibiotics and SCDs ordered on call to the OR to meet SCIP guidelines and adhere to recommendation laid forth in [place] Number 104 May 2009  "Antibiotic Prophylaxis  for Gynecologic Procedures".

## 2017-02-13 MED ORDER — GENTAMICIN SULFATE 40 MG/ML IJ SOLN
INTRAMUSCULAR | Status: AC
  Administered 2017-02-14: 100 mL via INTRAVENOUS
  Filled 2017-02-13: qty 12

## 2017-02-14 ENCOUNTER — Observation Stay
Admission: RE | Admit: 2017-02-14 | Discharge: 2017-02-15 | Disposition: A | Discharge status: Home / Self Care | Payer: Medicaid Other | Source: Ambulatory Visit | Attending: Obstetrics and Gynecology | Admitting: Obstetrics and Gynecology

## 2017-02-14 ENCOUNTER — Other Ambulatory Visit: Payer: Self-pay

## 2017-02-14 ENCOUNTER — Ambulatory Visit: Payer: Medicaid Other | Admitting: Anesthesiology

## 2017-02-14 ENCOUNTER — Encounter
Admission: RE | Disposition: A | Discharge status: Home / Self Care | Payer: Self-pay | Source: Ambulatory Visit | Attending: Obstetrics and Gynecology

## 2017-02-14 DIAGNOSIS — N800 Endometriosis of uterus: Secondary | ICD-10-CM | POA: Diagnosis not present

## 2017-02-14 DIAGNOSIS — D251 Intramural leiomyoma of uterus: Secondary | ICD-10-CM | POA: Diagnosis not present

## 2017-02-14 DIAGNOSIS — G43909 Migraine, unspecified, not intractable, without status migrainosus: Secondary | ICD-10-CM | POA: Diagnosis not present

## 2017-02-14 DIAGNOSIS — N838 Other noninflammatory disorders of ovary, fallopian tube and broad ligament: Secondary | ICD-10-CM | POA: Insufficient documentation

## 2017-02-14 DIAGNOSIS — N72 Inflammatory disease of cervix uteri: Secondary | ICD-10-CM | POA: Diagnosis not present

## 2017-02-14 DIAGNOSIS — K219 Gastro-esophageal reflux disease without esophagitis: Secondary | ICD-10-CM | POA: Diagnosis not present

## 2017-02-14 DIAGNOSIS — R102 Pelvic and perineal pain: Secondary | ICD-10-CM | POA: Insufficient documentation

## 2017-02-14 DIAGNOSIS — Z79899 Other long term (current) drug therapy: Secondary | ICD-10-CM | POA: Insufficient documentation

## 2017-02-14 DIAGNOSIS — G8929 Other chronic pain: Secondary | ICD-10-CM | POA: Insufficient documentation

## 2017-02-14 DIAGNOSIS — F909 Attention-deficit hyperactivity disorder, unspecified type: Secondary | ICD-10-CM | POA: Insufficient documentation

## 2017-02-14 DIAGNOSIS — N946 Dysmenorrhea, unspecified: Secondary | ICD-10-CM | POA: Diagnosis not present

## 2017-02-14 DIAGNOSIS — I10 Essential (primary) hypertension: Secondary | ICD-10-CM | POA: Diagnosis not present

## 2017-02-14 DIAGNOSIS — Z791 Long term (current) use of non-steroidal anti-inflammatories (NSAID): Secondary | ICD-10-CM | POA: Diagnosis not present

## 2017-02-14 DIAGNOSIS — Z86718 Personal history of other venous thrombosis and embolism: Secondary | ICD-10-CM | POA: Diagnosis not present

## 2017-02-14 DIAGNOSIS — E7800 Pure hypercholesterolemia, unspecified: Secondary | ICD-10-CM | POA: Diagnosis not present

## 2017-02-14 DIAGNOSIS — N920 Excessive and frequent menstruation with regular cycle: Secondary | ICD-10-CM | POA: Diagnosis not present

## 2017-02-14 DIAGNOSIS — Z90710 Acquired absence of both cervix and uterus: Secondary | ICD-10-CM | POA: Diagnosis present

## 2017-02-14 HISTORY — DX: Headache: R51

## 2017-02-14 HISTORY — PX: CYSTOSCOPY: SHX5120

## 2017-02-14 HISTORY — PX: LAPAROSCOPIC HYSTERECTOMY: SHX1926

## 2017-02-14 HISTORY — DX: Headache, unspecified: R51.9

## 2017-02-14 LAB — ABO/RH: ABO/RH(D): A POS

## 2017-02-14 LAB — POCT PREGNANCY, URINE: PREG TEST UR: NEGATIVE

## 2017-02-14 SURGERY — HYSTERECTOMY, TOTAL, LAPAROSCOPIC
Anesthesia: General

## 2017-02-14 MED ORDER — FENTANYL CITRATE (PF) 100 MCG/2ML IJ SOLN
INTRAMUSCULAR | Status: AC
  Administered 2017-02-14: 50 ug via INTRAVENOUS
  Filled 2017-02-14: qty 2

## 2017-02-14 MED ORDER — PHENYLEPHRINE HCL 10 MG/ML IJ SOLN
INTRAMUSCULAR | Status: DC | PRN
  Administered 2017-02-14: 100 ug via INTRAVENOUS

## 2017-02-14 MED ORDER — KETOROLAC TROMETHAMINE 30 MG/ML IJ SOLN
INTRAMUSCULAR | Status: AC
  Filled 2017-02-14: qty 1

## 2017-02-14 MED ORDER — OXYCODONE-ACETAMINOPHEN 5-325 MG PO TABS
1.0000 | ORAL_TABLET | ORAL | Status: DC | PRN
  Administered 2017-02-14 – 2017-02-15 (×2): 1 via ORAL
  Filled 2017-02-14 (×2): qty 1

## 2017-02-14 MED ORDER — SUGAMMADEX SODIUM 200 MG/2ML IV SOLN
INTRAVENOUS | Status: DC | PRN
  Administered 2017-02-14: 200 mg via INTRAVENOUS

## 2017-02-14 MED ORDER — ROCURONIUM BROMIDE 100 MG/10ML IV SOLN
INTRAVENOUS | Status: DC | PRN
  Administered 2017-02-14: 50 mg via INTRAVENOUS

## 2017-02-14 MED ORDER — FENTANYL CITRATE (PF) 100 MCG/2ML IJ SOLN
INTRAMUSCULAR | Status: DC | PRN
  Administered 2017-02-14 (×2): 50 ug via INTRAVENOUS

## 2017-02-14 MED ORDER — ACETAMINOPHEN 10 MG/ML IV SOLN
INTRAVENOUS | Status: DC | PRN
  Administered 2017-02-14: 1000 mg via INTRAVENOUS

## 2017-02-14 MED ORDER — LIDOCAINE HCL (CARDIAC) 20 MG/ML IV SOLN
INTRAVENOUS | Status: DC | PRN
  Administered 2017-02-14: 100 mg via INTRAVENOUS

## 2017-02-14 MED ORDER — ONDANSETRON HCL 4 MG/2ML IJ SOLN
INTRAMUSCULAR | Status: DC | PRN
  Administered 2017-02-14: 4 mg via INTRAVENOUS

## 2017-02-14 MED ORDER — MENTHOL 3 MG MT LOZG
1.0000 | LOZENGE | OROMUCOSAL | Status: DC | PRN
  Filled 2017-02-14: qty 9

## 2017-02-14 MED ORDER — HYDROMORPHONE HCL 1 MG/ML IJ SOLN
INTRAMUSCULAR | Status: AC
  Filled 2017-02-14: qty 1

## 2017-02-14 MED ORDER — HYDROMORPHONE HCL 1 MG/ML IJ SOLN
INTRAMUSCULAR | Status: AC
  Administered 2017-02-14: 0.5 mg via INTRAVENOUS
  Filled 2017-02-14: qty 1

## 2017-02-14 MED ORDER — ROCURONIUM BROMIDE 50 MG/5ML IV SOLN
INTRAVENOUS | Status: AC
  Filled 2017-02-14: qty 1

## 2017-02-14 MED ORDER — PROPOFOL 10 MG/ML IV BOLUS
INTRAVENOUS | Status: DC | PRN
  Administered 2017-02-14: 200 mg via INTRAVENOUS

## 2017-02-14 MED ORDER — DEXAMETHASONE SODIUM PHOSPHATE 10 MG/ML IJ SOLN
INTRAMUSCULAR | Status: AC
  Filled 2017-02-14: qty 1

## 2017-02-14 MED ORDER — KETOROLAC TROMETHAMINE 30 MG/ML IJ SOLN
INTRAMUSCULAR | Status: DC | PRN
  Administered 2017-02-14: 30 mg via INTRAVENOUS

## 2017-02-14 MED ORDER — AMPHETAMINE-DEXTROAMPHETAMINE 5 MG PO TABS
15.0000 mg | ORAL_TABLET | Freq: Two times a day (BID) | ORAL | Status: DC
  Administered 2017-02-14 – 2017-02-15 (×3): 15 mg via ORAL
  Filled 2017-02-14 (×6): qty 3

## 2017-02-14 MED ORDER — FENTANYL CITRATE (PF) 100 MCG/2ML IJ SOLN
25.0000 ug | INTRAMUSCULAR | Status: DC | PRN
  Administered 2017-02-14: 25 ug via INTRAVENOUS
  Administered 2017-02-14 (×2): 50 ug via INTRAVENOUS
  Administered 2017-02-14: 25 ug via INTRAVENOUS

## 2017-02-14 MED ORDER — MIDAZOLAM HCL 2 MG/2ML IJ SOLN
INTRAMUSCULAR | Status: DC | PRN
  Administered 2017-02-14: 2 mg via INTRAVENOUS

## 2017-02-14 MED ORDER — BUPIVACAINE HCL (PF) 0.5 % IJ SOLN
INTRAMUSCULAR | Status: AC
  Filled 2017-02-14: qty 30

## 2017-02-14 MED ORDER — PHENYLEPHRINE HCL 10 MG/ML IJ SOLN
INTRAMUSCULAR | Status: AC
  Filled 2017-02-14: qty 1

## 2017-02-14 MED ORDER — DEXAMETHASONE SODIUM PHOSPHATE 10 MG/ML IJ SOLN
INTRAMUSCULAR | Status: DC | PRN
  Administered 2017-02-14: 10 mg via INTRAVENOUS

## 2017-02-14 MED ORDER — HYDROMORPHONE HCL 1 MG/ML IJ SOLN
0.5000 mg | INTRAMUSCULAR | Status: DC | PRN
  Administered 2017-02-14 (×3): 0.5 mg via INTRAVENOUS

## 2017-02-14 MED ORDER — KETOROLAC TROMETHAMINE 30 MG/ML IJ SOLN
30.0000 mg | Freq: Four times a day (QID) | INTRAMUSCULAR | Status: DC
  Administered 2017-02-14 – 2017-02-15 (×3): 30 mg via INTRAVENOUS
  Filled 2017-02-14 (×3): qty 1

## 2017-02-14 MED ORDER — PROPOFOL 10 MG/ML IV BOLUS
INTRAVENOUS | Status: AC
  Filled 2017-02-14: qty 20

## 2017-02-14 MED ORDER — LACTATED RINGERS IV SOLN
INTRAVENOUS | Status: DC
  Administered 2017-02-14 (×2): via INTRAVENOUS

## 2017-02-14 MED ORDER — DEXTROSE-NACL 5-0.45 % IV SOLN
INTRAVENOUS | Status: DC
  Administered 2017-02-14 – 2017-02-15 (×3): via INTRAVENOUS

## 2017-02-14 MED ORDER — ACETAMINOPHEN NICU IV SYRINGE 10 MG/ML
INTRAVENOUS | Status: AC
  Filled 2017-02-14: qty 1

## 2017-02-14 MED ORDER — EPHEDRINE SULFATE 50 MG/ML IJ SOLN
INTRAMUSCULAR | Status: AC
  Filled 2017-02-14: qty 1

## 2017-02-14 MED ORDER — EPHEDRINE SULFATE 50 MG/ML IJ SOLN
INTRAMUSCULAR | Status: DC | PRN
  Administered 2017-02-14 (×2): 5 mg via INTRAVENOUS

## 2017-02-14 MED ORDER — BUPIVACAINE HCL 0.5 % IJ SOLN
INTRAMUSCULAR | Status: DC | PRN
  Administered 2017-02-14: 16 mL

## 2017-02-14 MED ORDER — METOPROLOL TARTRATE 25 MG PO TABS
25.0000 mg | ORAL_TABLET | Freq: Every day | ORAL | Status: DC
  Administered 2017-02-15: 25 mg via ORAL
  Filled 2017-02-14: qty 1

## 2017-02-14 MED ORDER — SUGAMMADEX SODIUM 200 MG/2ML IV SOLN
INTRAVENOUS | Status: AC
  Filled 2017-02-14: qty 2

## 2017-02-14 MED ORDER — LACTATED RINGERS IV SOLN
INTRAVENOUS | Status: DC | PRN
  Administered 2017-02-14: 07:00:00 via INTRAVENOUS

## 2017-02-14 MED ORDER — OXYCODONE HCL 5 MG/5ML PO SOLN: 5.0000 mg | Freq: Once | ORAL | Status: DC | PRN

## 2017-02-14 MED ORDER — LIDOCAINE HCL (PF) 2 % IJ SOLN
INTRAMUSCULAR | Status: AC
  Filled 2017-02-14: qty 10

## 2017-02-14 MED ORDER — MORPHINE SULFATE (PF) 2 MG/ML IV SOLN
1.0000 mg | INTRAVENOUS | Status: DC | PRN
  Administered 2017-02-14 (×2): 2 mg via INTRAVENOUS
  Filled 2017-02-14 (×2): qty 1

## 2017-02-14 MED ORDER — MIDAZOLAM HCL 2 MG/2ML IJ SOLN
INTRAMUSCULAR | Status: AC
  Filled 2017-02-14: qty 2

## 2017-02-14 MED ORDER — MEPERIDINE HCL 50 MG/ML IJ SOLN: 6.2500 mg | INTRAMUSCULAR | Status: DC | PRN

## 2017-02-14 MED ORDER — KETOROLAC TROMETHAMINE 30 MG/ML IJ SOLN: 30.0000 mg | Freq: Four times a day (QID) | INTRAMUSCULAR | Status: DC

## 2017-02-14 MED ORDER — FENTANYL CITRATE (PF) 100 MCG/2ML IJ SOLN
INTRAMUSCULAR | Status: AC
  Filled 2017-02-14: qty 2

## 2017-02-14 MED ORDER — OXYCODONE HCL 5 MG PO TABS: 5.0000 mg | ORAL_TABLET | Freq: Once | ORAL | Status: DC | PRN

## 2017-02-14 MED ORDER — ONDANSETRON HCL 4 MG/2ML IJ SOLN
INTRAMUSCULAR | Status: AC
  Filled 2017-02-14: qty 2

## 2017-02-14 MED ORDER — PROMETHAZINE HCL 25 MG/ML IJ SOLN: 6.2500 mg | INTRAMUSCULAR | Status: DC | PRN

## 2017-02-14 SURGICAL SUPPLY — 53 items
BAG URINE DRAINAGE (UROLOGICAL SUPPLIES) ×4 IMPLANT
BLADE SURG SZ11 CARB STEEL (BLADE) ×4 IMPLANT
CANISTER SUCT 1200ML W/VALVE (MISCELLANEOUS) ×4 IMPLANT
CATH FOLEY 2WAY  5CC 16FR (CATHETERS) ×2
CATH URTH 16FR FL 2W BLN LF (CATHETERS) ×2 IMPLANT
CHLORAPREP W/TINT 26ML (MISCELLANEOUS) ×4 IMPLANT
COUNTER NEEDLE 20/40 LG (NEEDLE) ×4 IMPLANT
DEFOGGER SCOPE WARMER CLEARIFY (MISCELLANEOUS) ×4 IMPLANT
DERMABOND ADVANCED (GAUZE/BANDAGES/DRESSINGS) ×6
DERMABOND ADVANCED .7 DNX12 (GAUZE/BANDAGES/DRESSINGS) ×6 IMPLANT
DEVICE PMI PUNCTURE CLOSURE (MISCELLANEOUS) ×4 IMPLANT
DEVICE SUTURE ENDOST 10MM (ENDOMECHANICALS) ×4 IMPLANT
GLOVE BIO SURGEON STRL SZ7 (GLOVE) ×16 IMPLANT
GLOVE INDICATOR 7.5 STRL GRN (GLOVE) ×16 IMPLANT
GOWN STRL REUS W/ TWL LRG LVL3 (GOWN DISPOSABLE) ×4 IMPLANT
GOWN STRL REUS W/ TWL XL LVL3 (GOWN DISPOSABLE) ×2 IMPLANT
GOWN STRL REUS W/TWL LRG LVL3 (GOWN DISPOSABLE) ×4
GOWN STRL REUS W/TWL XL LVL3 (GOWN DISPOSABLE) ×2
IRRIGATION STRYKERFLOW (MISCELLANEOUS) ×2 IMPLANT
IRRIGATOR STRYKERFLOW (MISCELLANEOUS) ×4
IV LACTATED RINGERS 1000ML (IV SOLUTION) ×4 IMPLANT
IV NS 1000ML (IV SOLUTION) ×2
IV NS 1000ML BAXH (IV SOLUTION) ×2 IMPLANT
KIT PINK PAD W/HEAD ARE REST (MISCELLANEOUS) ×4
KIT PINK PAD W/HEAD ARM REST (MISCELLANEOUS) ×2 IMPLANT
LABEL OR SOLS (LABEL) ×4 IMPLANT
MANIPULATOR VCARE LG CRV RETR (MISCELLANEOUS) IMPLANT
MANIPULATOR VCARE SML CRV RETR (MISCELLANEOUS) ×4 IMPLANT
MANIPULATOR VCARE STD CRV RETR (MISCELLANEOUS) IMPLANT
NS IRRIG 1000ML POUR BTL (IV SOLUTION) ×4 IMPLANT
NS IRRIG 500ML POUR BTL (IV SOLUTION) ×4 IMPLANT
OCCLUDER COLPOPNEUMO (BALLOONS) ×4 IMPLANT
PACK GYN LAPAROSCOPIC (MISCELLANEOUS) ×4 IMPLANT
PAD OB MATERNITY 4.3X12.25 (PERSONAL CARE ITEMS) ×4 IMPLANT
PAD PREP 24X41 OB/GYN DISP (PERSONAL CARE ITEMS) ×4 IMPLANT
SCISSORS METZENBAUM CVD 33 (INSTRUMENTS) ×4 IMPLANT
SET CYSTO W/LG BORE CLAMP LF (SET/KITS/TRAYS/PACK) ×4 IMPLANT
SHEARS HARMONIC ACE PLUS 36CM (ENDOMECHANICALS) ×4 IMPLANT
SLEEVE ENDOPATH XCEL 5M (ENDOMECHANICALS) ×8 IMPLANT
SPONGE LAP 18X18 5 PK (GAUZE/BANDAGES/DRESSINGS) IMPLANT
SPONGE XRAY 4X4 16PLY STRL (MISCELLANEOUS) ×4 IMPLANT
STRAP SAFETY BODY (MISCELLANEOUS) ×4 IMPLANT
SURGILUBE 2OZ TUBE FLIPTOP (MISCELLANEOUS) ×4 IMPLANT
SUT ENDO VLOC 180-0-8IN (SUTURE) ×8 IMPLANT
SUT VIC AB 0 CT1 27 (SUTURE) ×2
SUT VIC AB 0 CT1 27XCR 8 STRN (SUTURE) ×2 IMPLANT
SUT VIC AB 2-0 UR6 27 (SUTURE) IMPLANT
SUT VIC AB 4-0 FS2 27 (SUTURE) ×4 IMPLANT
SYR 10ML LL (SYRINGE) ×4 IMPLANT
SYR 50ML LL SCALE MARK (SYRINGE) ×4 IMPLANT
TROCAR ENDO BLADELESS 11MM (ENDOMECHANICALS) ×4 IMPLANT
TROCAR XCEL NON-BLD 5MMX100MML (ENDOMECHANICALS) ×4 IMPLANT
TUBING INSUF HEATED (TUBING) ×4 IMPLANT

## 2017-02-14 NOTE — H&P (Signed)
Date of Initial H&P: 02/04/2017  History reviewed, patient examined, no change in status, stable for surgery.

## 2017-02-14 NOTE — Anesthesia Postprocedure Evaluation (Addendum)
Anesthesia Post Note  Patient: Ashley Rose  Procedure(s) Performed: HYSTERECTOMY TOTAL LAPAROSCOPIC BILATERAL SALPINGECTOMY (Bilateral ) CYSTOSCOPY (N/A )  Patient location during evaluation: PACU Anesthesia Type: General Level of consciousness: awake and alert and oriented Pain management: pain level controlled Vital Signs Assessment: post-procedure vital signs reviewed and stable Respiratory status: spontaneous breathing, nonlabored ventilation and respiratory function stable Cardiovascular status: blood pressure returned to baseline and stable Postop Assessment: no signs of nausea or vomiting Anesthetic complications: no     Last Vitals:  Vitals:   02/14/17 1052 02/14/17 1111  BP: 116/66 (!) 103/52  Pulse: 81 72  Resp: 11 14  Temp: 36.5 C (!) 36.2 C  SpO2: 93% 93%    Last Pain:  Vitals:   02/14/17 1052  PainSc: 5                  Raymond Azure

## 2017-02-14 NOTE — Op Note (Signed)
Preoperative Diagnosis: 1) 42 y.o. with menorrhagia 2) Dysmenorrhea  Postoperative Diagnosis: 1) 42 y.o. with menorrhagia 2) Dysmenorrhea 3) Uterine fibroid  Operation Performed: Total laparoscopic hysterectomy, bilateral salpingectomy, and cystoscopy  Indication:  Failed medical management for menorrhagia  Surgeon: Malachy Mood, MD  Assistant:  Anesthesia: Choice  Preoperative Antibiotics: none  Estimated Blood Loss: 50 mL  IV Fluids: 8101mL  Urine Output:: 257mL  Drains or Tubes: none  Implants: none  Specimens Removed: Uterus, cervix, and bilateral fallopian tubes  Complications: none  Intraoperative Findings: Normal cervix, normal ovaries, normal tubes.  Uterus with 3cm posterior intramural fibroid.    Patient Condition: stable  Procedure in Detail:  Patient was taken to the operating room where she was administered general anesthesia.  She was positioned in the dorsal lithotomy position utilizing Allen stirups, prepped and draped in the usual sterile fashion.  Prior to proceeding with procedure a time out was performed.  Attention was turned to the patient's pelvis.  An indwelling foley catheter was placed to decompress the patient's bladder.  An operative speculum was placed to allow visualization of the cervix.  The Mirena IUD strings were grasped and the IUD removed.  The anterior lip of the cervix was grasped with a single tooth tenaculum, and a small V-care uterine manipulator was placed to allow manipulation of the uterus.  The operative speculum and single tooth tenaculum were then removed.  Attention was turned to the patient's abdomen.  The umbilicus was infiltrated with 1% Sensorcaine, before making a stab incision using an 11 blade scalpel.  A 28mm Excel trocar was then used to gain direct entry into the peritoneal cavity utilizing the camera to visualize progress of the trocar during placement.  Once peritoneal entry had been achieved, insufflation was  started and pneumoperitoneum established at a pressure of 50mmHg.    One left and one right lower quadrant site were then injected with 1% Sensorcaine and a stab incision was made using an 11 blade scalpel.  Two additional 36mm Excel trocars were placed through these incisions under direct visualization. General inspection of the abdomen revealed the above noted findings.   The right tube was identified and grasped at its fimbriated end.  The tube was transected from its attachments to the ovary and mesosalpinx using a 84mm Harmonic scalpel.  The utero ovarian ligament was identified ligated and transected using the Harmonic scalpel. The round ligament was then likewise ligated and transected.  The anterior leaf of the broad ligament was dissected down to the level of the internal cervical os and a bladder flap was started.  The posterior leaf of the broad ligament was dissected down to the utero-sacral ligament.  The uterine artery was skeletonized before being ligated and transected using the Harmonic scalpel with cephelad pressure applied to the V-care device to assure lateralization of the ureter.  A bite was then taken with Harmonic medial to transected portio of uterine artery to further lateralize the ureter and vessel off the V-care cup.  The patient left adnexal structures were then dissected in similar fashion.  The bladder flap was completed and the bladder mobilized off the V-care cup.  An anterior colpotomy was scores and carried around in a clockwise fashion to free the specimen, which was then removed vaginally.  Inspection revealed all pedicles to be hemostatic before proceed with vaginal closure.  Attention was turned to the patient pelvis.  An operative speculum was placed and the anterior and posterior portion of the vaginal cuff were  tagged with long Alice clamps.  The cuff was closed using Endostitch in a running fashion using a V-loc load.  The cuff was hemsotatic at the conclusion of closure  without visible or palpable defects.  The indwelling foley catheter was removed.  Cystoscopy was performed noting and intact bladder dome as well as brisk efflux of urine from bother ureteral orifices.  The cystoscopy was removed and the indwelling foley catheter was replaced.  Pneumoperitoneum was re-established, the pelvis was irrigated and all pedicles were once again inspected and noted to be hemostatic.  The cuff appeared well closed and free of bowl or other tissue that may have inadvertently been incorporated into the vaginal closure.  Additional hemostatic agents were not applied to all pedicles.      Pneumoperitoneum was evacuated and trocars were removed.  All trocar sites were then dressed with surgical skin glue.  Sponge needle and instrument counts were correct time two.  The patient tolerated the procedure well and was taken to the recovery room in stable condition.

## 2017-02-14 NOTE — Anesthesia Preprocedure Evaluation (Signed)
Anesthesia Evaluation  Patient identified by MRN, date of birth, ID band Patient awake    Reviewed: Allergy & Precautions, NPO status , Patient's Chart, lab work & pertinent test results  History of Anesthesia Complications Negative for: history of anesthetic complications  Airway Mallampati: III  TM Distance: <3 FB Neck ROM: Full    Dental no notable dental hx.    Pulmonary neg pulmonary ROS, neg sleep apnea, neg COPD,    breath sounds clear to auscultation- rhonchi (-) wheezing      Cardiovascular Exercise Tolerance: Good hypertension, Pt. on medications (-) CAD, (-) Past MI, (-) Cardiac Stents and (-) CABG  Rhythm:Regular Rate:Normal - Systolic murmurs and - Diastolic murmurs    Neuro/Psych  Headaches, PSYCHIATRIC DISORDERS    GI/Hepatic Neg liver ROS, GERD  ,  Endo/Other  negative endocrine ROSneg diabetes  Renal/GU negative Renal ROS     Musculoskeletal negative musculoskeletal ROS (+)   Abdominal (+) + obese,   Peds  Hematology negative hematology ROS (+)   Anesthesia Other Findings Past Medical History: No date: ADHD (attention deficit hyperactivity disorder) No date: DVT (deep venous thrombosis) (HCC)     Comment:  Right leg No date: Family history of adverse reaction to anesthesia     Comment:  grandmother had a hard time waking up No date: GERD (gastroesophageal reflux disease) No date: Headache     Comment:  migraines No date: History of kidney stones No date: Hypercholesteremia No date: Hypertension No date: Seizures ([place])     Comment:  last seizure at age 43   Reproductive/Obstetrics                             Anesthesia Physical Anesthesia Plan  ASA: II  Anesthesia Plan: General   Post-op Pain Management:    Induction: Intravenous  PONV Risk Score and Plan: 2 and Dexamethasone and Ondansetron  Airway Management Planned: Oral ETT  Additional Equipment:    Intra-op Plan:   Post-operative Plan: Extubation in OR  Informed Consent: I have reviewed the patients History and Physical, chart, labs and discussed the procedure including the risks, benefits and alternatives for the proposed anesthesia with the patient or authorized representative who has indicated his/her understanding and acceptance.   Dental advisory given  Plan Discussed with: CRNA and Anesthesiologist  Anesthesia Plan Comments:         Anesthesia Quick Evaluation

## 2017-02-14 NOTE — Anesthesia Post-op Follow-up Note (Signed)
Anesthesia QCDR form completed.        

## 2017-02-14 NOTE — Anesthesia Procedure Notes (Signed)
Procedure Name: Intubation Date/Time: 02/14/2017 7:55 AM Performed by: Emmie Niemann, MD Pre-anesthesia Checklist: Patient identified, Patient being monitored, Timeout performed, Emergency Drugs available and Suction available Patient Re-evaluated:Patient Re-evaluated prior to induction Oxygen Delivery Method: Circle system utilized Preoxygenation: Pre-oxygenation with 100% oxygen Induction Type: IV induction Ventilation: Mask ventilation without difficulty and Oral airway inserted - appropriate to patient size Laryngoscope Size: Mac and 3 Grade View: Grade II Tube type: Oral Tube size: 7.0 mm Number of attempts: 1 Airway Equipment and Method: Stylet Placement Confirmation: ETT inserted through vocal cords under direct vision,  positive ETCO2 and breath sounds checked- equal and bilateral Secured at: 21 cm Tube secured with: Tape Dental Injury: Teeth and Oropharynx as per pre-operative assessment

## 2017-02-14 NOTE — Transfer of Care (Signed)
Immediate Anesthesia Transfer of Care Note  Patient: Ashley Rose  Procedure(s) Performed: HYSTERECTOMY TOTAL LAPAROSCOPIC BILATERAL SALPINGECTOMY (Bilateral ) CYSTOSCOPY (N/A )  Patient Location: PACU  Anesthesia Type:General  Level of Consciousness: awake and alert   Airway & Oxygen Therapy: Patient Spontanous Breathing and Patient connected to face mask oxygen  Post-op Assessment: Report given to RN and Post -op Vital signs reviewed and stable  Post vital signs: Reviewed and stable  Last Vitals:  Vitals:   02/14/17 0611 02/14/17 0952  BP: 125/76 115/62  Pulse: 64 73  Resp: 18 17  Temp: 36.4 C 36.7 C  SpO2: 98% 100%    Last Pain:  Vitals:   02/14/17 0708  PainSc: 6          Complications: No apparent anesthesia complications

## 2017-02-15 ENCOUNTER — Encounter: Payer: Self-pay | Admitting: Obstetrics and Gynecology

## 2017-02-15 DIAGNOSIS — N920 Excessive and frequent menstruation with regular cycle: Secondary | ICD-10-CM | POA: Diagnosis not present

## 2017-02-15 LAB — CBC
HEMATOCRIT: 39.8 % (ref 35.0–47.0)
HEMOGLOBIN: 13.1 g/dL (ref 12.0–16.0)
MCH: 29.5 pg (ref 26.0–34.0)
MCHC: 32.8 g/dL (ref 32.0–36.0)
MCV: 89.8 fL (ref 80.0–100.0)
Platelets: 255 10*3/uL (ref 150–440)
RBC: 4.43 MIL/uL (ref 3.80–5.20)
RDW: 13.7 % (ref 11.5–14.5)
WBC: 13.9 10*3/uL — AB (ref 3.6–11.0)

## 2017-02-15 LAB — BASIC METABOLIC PANEL
ANION GAP: 10 (ref 5–15)
BUN: 8 mg/dL (ref 6–20)
CHLORIDE: 107 mmol/L (ref 101–111)
CO2: 20 mmol/L — AB (ref 22–32)
Calcium: 8.5 mg/dL — ABNORMAL LOW (ref 8.9–10.3)
Creatinine, Ser: 0.59 mg/dL (ref 0.44–1.00)
GFR calc non Af Amer: >60 mL/min (ref >60)
Glucose, Bld: 128 mg/dL — ABNORMAL HIGH (ref 65–99)
POTASSIUM: 3.9 mmol/L (ref 3.5–5.1)
SODIUM: 137 mmol/L (ref 135–145)

## 2017-02-15 LAB — SURGICAL PATHOLOGY

## 2017-02-15 MED ORDER — ENOXAPARIN SODIUM 40 MG/0.4ML ~~LOC~~ SOLN: 40.0000 mg | SUBCUTANEOUS | 0 refills | Status: DC

## 2017-02-15 MED ORDER — OXYCODONE-ACETAMINOPHEN 5-325 MG PO TABS: 1.0000 | ORAL_TABLET | ORAL | 0 refills | Status: DC | PRN

## 2017-02-15 MED ORDER — ENOXAPARIN SODIUM 40 MG/0.4ML ~~LOC~~ SOLN
40.0000 mg | SUBCUTANEOUS | Status: DC
  Administered 2017-02-15: 40 mg via SUBCUTANEOUS
  Filled 2017-02-15: qty 0.4

## 2017-02-15 MED ORDER — IBUPROFEN 600 MG PO TABS: 600.0000 mg | ORAL_TABLET | Freq: Four times a day (QID) | ORAL | 3 refills | Status: DC | PRN

## 2017-02-15 NOTE — Discharge Summary (Signed)
Physician Discharge Summary  Patient ID: Ashley Rose MRN: [id number] DOB/AGE: [birth year] 42 y.o.  Admit date: 02/14/2017 Discharge date: 02/15/2017  Admission Diagnoses: Menorrhagia, dysmenorrhea  Discharge Diagnoses:  Active Problems:   S/P laparoscopic hysterectomy   Discharged Condition: good  Hospital Course: Patient admitted for [place], BS, cystoscopy for menorrhagia, dysmenorrhea, and chronic pelvic pain failing medical management.  Procedure uncomplicated.  Patient monitored overnight without issues.  POD1 kidney function and H&H stable.  She remained afebrile and hemodynamically stable throughout her admission. She was discharged on POD1 after meeting all postoperative goals.  Given history of DVT will have patient on 20 day lovenox course.    Consults: None  Significant Diagnostic Studies:  Results for orders placed or performed during the hospital encounter of 02/14/17 (from the past 72 hour(s))  Pregnancy, urine POC     Status: None   Collection Time: 02/14/17  7:09 AM  Result Value Ref Range   Preg Test, Ur NEGATIVE NEGATIVE    Comment:        THE SENSITIVITY OF THIS METHODOLOGY IS >24 mIU/mL   ABO/Rh     Status: None   Collection Time: 02/14/17  7:37 AM  Result Value Ref Range   ABO/RH(D)      A POS Performed at [facility], [address] [place], [place] [address]   CBC     Status: Abnormal   Collection Time: 02/15/17  4:47 AM  Result Value Ref Range   WBC 13.9 (H) 3.6 - 11.0 K/uL   RBC 4.43 3.80 - 5.20 MIL/uL   Hemoglobin 13.1 12.0 - 16.0 g/dL   HCT 39.8 35.0 - 47.0 %   MCV 89.8 80.0 - 100.0 fL   MCH 29.5 26.0 - 34.0 pg   MCHC 32.8 32.0 - 36.0 g/dL   RDW 13.7 11.5 - 14.5 %   Platelets 255 150 - 440 K/uL    Comment: Performed at [facility], [address] [place], [place] [address]  Basic metabolic panel     Status: Abnormal   Collection Time: 02/15/17  4:47 AM  Result Value Ref Range   Sodium 137 135 - 145 mmol/L   Potassium 3.9 3.5 - 5.1 mmol/L   Chloride 107 101 - 111 mmol/L   CO2 20 (L) 22 - 32 mmol/L   Glucose, Bld 128 (H) 65 - 99 mg/dL   BUN 8 6 - 20 mg/dL   Creatinine, Ser 0.59 0.44 - 1.00 mg/dL   Calcium 8.5 (L) 8.9 - 10.3 mg/dL   GFR calc non Af Amer >60 >60 mL/min   GFR calc Af Amer >60 >60 mL/min    Comment: (NOTE) The eGFR has been calculated using the CKD EPI equation. This calculation has not been validated in all clinical situations. eGFR's persistently <60 mL/min signify possible Chronic Kidney Disease.    Anion gap 10 5 - 15    Comment: Performed at [facility], [address] [place], [place] [address]     Treatments: surgery: TLH, BS, cysto  Discharge Exam: Blood pressure 118/72, pulse 83, temperature 98.3 F (36.8 C), temperature source Oral, resp. rate 18, height 5' 3"  (1.6 m), weight 210 lb (95.3 kg), last menstrual period 02/03/2017, SpO2 93 %. General appearance: alert, appears stated age and no distress Resp: clear to auscultation bilaterally GI: soft, non-tender; bowel sounds normal; no masses,  no organomegaly Skin: Skin color, texture, turgor normal. No rashes or lesions Incision/Wound: D/C/I trocar sites  Disposition: 01-Home or  Self Care   Allergies as of 02/15/2017      Reactions   Corn-containing Products Other (See Comments)   Sinus congestion    Milk-related Compounds Other (See Comments)   Sinus congestion    Penicillins Other (See Comments)   Has patient had a PCN reaction causing immediate rash, facial/tongue/throat swelling, SOB or lightheadedness with hypotension: Unknown Has patient had a PCN reaction causing severe rash involving mucus membranes or skin necrosis: Unknown Has patient had a PCN reaction that required hospitalization: Unknown Has patient had a PCN reaction occurring within the last 10 years: No If all of the above answers are "NO", then may proceed with Cephalosporin use.   Eggshell Membrane (chicken) [egg Shells]  Other (See Comments)   Sinus congestion      Medication List    TAKE these medications   amphetamine-dextroamphetamine 15 MG tablet Commonly known as:  ADDERALL Take 15 mg by mouth 2 (two) times daily.   calcium carbonate 500 MG chewable tablet Commonly known as:  TUMS - dosed in mg elemental calcium Chew 2 tablets by mouth daily as needed for indigestion or heartburn.   cetirizine 10 MG tablet Commonly known as:  ZYRTEC Take 10 mg by mouth daily.   enoxaparin 40 MG/0.4ML injection Commonly known as:  LOVENOX Inject 0.4 mLs (40 mg total) into the skin daily for 20 days.   EPINEPHrine 0.3 mg/0.3 mL Soaj injection Commonly known as:  EPI-PEN Inject 0.3 mg into the muscle as needed.   ibuprofen 600 MG tablet Commonly known as:  ADVIL,MOTRIN Take 1 tablet (600 mg total) by mouth every 6 (six) hours as needed.   metoprolol tartrate 25 MG tablet Commonly known as:  LOPRESSOR TAKE 1 TABLET BY MOUTH TWICE DAILY (STOP METOPROLOL SUCCINATE ER 25 MG DAILY)   naproxen 500 MG tablet Commonly known as:  NAPROSYN Take 1 tablet (500 mg total) by mouth 2 (two) times daily with a meal. What changed:    when to take this  reasons to take this   ondansetron 4 MG tablet Commonly known as:  ZOFRAN Take 4 mg by mouth every 8 (eight) hours as needed for nausea or vomiting.   oxyCODONE-acetaminophen 5-325 MG tablet Commonly known as:  PERCOCET/ROXICET Take 1-2 tablets by mouth every 4 (four) hours as needed for moderate pain or severe pain.   simvastatin 40 MG tablet Commonly known as:  ZOCOR Take 40 mg by mouth daily at 6 PM.   SUMAtriptan 25 MG tablet Commonly known as:  IMITREX Take 25 mg by mouth every 2 (two) hours as needed for migraine. May repeat in 2 hours if headache persists or recurs.   Vitamin D 2000 units Caps Take 2,000 Units by mouth daily.      Follow-up Information    Malachy Mood, MD. Schedule an appointment as soon as possible for a visit in 1  week(s).   Specialty:  Obstetrics and Gynecology Why:  incision check Contact information: [address] [place] [address] [phone]           Signed: Malachy Mood 02/15/2017, 7:41 AM

## 2017-02-15 NOTE — Progress Notes (Signed)
Discharge order received from doctor. Reviewed discharge instructions and prescriptions with patient and answered all questions. Reviewed Lovenox injection instructions with patient and patients mother. Follow up appointment given. Patient verbalized understanding. Patient discharged home via wheelchair by nursing/auxillary.    Hilbert Bible, RN

## 2017-02-22 ENCOUNTER — Encounter: Payer: Self-pay | Admitting: Obstetrics and Gynecology

## 2017-02-22 ENCOUNTER — Ambulatory Visit (INDEPENDENT_AMBULATORY_CARE_PROVIDER_SITE_OTHER): Payer: Medicaid Other | Admitting: Obstetrics and Gynecology

## 2017-02-22 VITALS — BP 108/78 | HR 85 | Wt 204.0 lb

## 2017-02-22 DIAGNOSIS — Z4889 Encounter for other specified surgical aftercare: Secondary | ICD-10-CM

## 2017-02-25 ENCOUNTER — Ambulatory Visit: Payer: Medicaid Other | Admitting: Obstetrics and Gynecology

## 2017-02-26 NOTE — Progress Notes (Signed)
      Postoperative Follow-up Patient presents post op from Ashley Rose, BS, cystoscopy 1weeks ago for abnormal uterine bleeding.  Subjective: Patient reports some improvement in her preop symptoms. Eating a regular diet without difficulty. Pain is controlled with current analgesics. Medications being used: prescription NSAID's including ibuprofen (Motrin) and narcotic analgesics including Percocet.  Activity: still some limited mobility secondary to pain.  No vaginal bleeding.  Objective: Vitals:   02/22/17 1029  BP: 108/78  Pulse: 85   Gen: NAD HEENT: normocephalic, anicteric Pulmonary: no increased work of breathing Abdomen: soft, non-tender, non-distended, trocar sites D/C/I Ext: no edema  Pathology 02/15/16  Surgical Pathology  CASE: [id number]  PATIENT: Ashley Rose  Surgical Pathology Report      SPECIMEN SUBMITTED:  A. Uterus with cervix, bilateral tubes   CLINICAL HISTORY:  None provided   PRE-OPERATIVE DIAGNOSIS:  Abnormal uterine bleeding, dysmenorrhea   POST-OPERATIVE DIAGNOSIS:  None provided.      DIAGNOSIS:  A. UTERUS WITH CERVIX AND BILATERAL FALLOPIAN TUBES; TOTAL HYSTERECTOMY  WITH BILATERAL SALPINGECTOMY:  - CHRONIC CERVICITIS WITH FOCAL SQUAMOUS METAPLASIA.  - ENDOMETRIUM WITH DECIDUALIZED STROMA CONSISTENT WITH PROGESTIN EFFECT.  - MYOMETRIUM WITH ADENOMYOSIS AND INTRAMURAL LEIOMYOMATA, LARGEST  MEASURING 2.1 CM.  - BILATERAL FALLOPIAN TUBES WITH FIBRIATED END AND BENIGN PARATUBAL  CYSTS.  - NEGATIVE FOR ATYPIA AND MALIGNANCY.        Assessment: 42 y.o. s/p TLH, BS, and cystoscopy stable  Plan: Patient has done well after surgery with no apparent complications.  I have discussed the post-operative course to date, and the expected progress moving forward.  The patient understands what complications to be concerned about.  I will see the patient in routine follow up, or sooner if needed.    Activity plan: No heavy lifting, no  intercourse  Malachy Mood 02/26/2017, 11:51 AM

## 2017-03-29 ENCOUNTER — Encounter: Payer: Self-pay | Admitting: Obstetrics and Gynecology

## 2017-03-29 ENCOUNTER — Ambulatory Visit (INDEPENDENT_AMBULATORY_CARE_PROVIDER_SITE_OTHER): Payer: Medicaid Other | Admitting: Obstetrics and Gynecology

## 2017-03-29 VITALS — BP 104/72 | HR 78 | Ht 63.0 in | Wt 200.0 lb

## 2017-03-29 DIAGNOSIS — Z09 Encounter for follow-up examination after completed treatment for conditions other than malignant neoplasm: Secondary | ICD-10-CM

## 2017-03-29 NOTE — Progress Notes (Signed)
Postoperative Follow-up Patient presents post op from [place], BS, and cystoscopy 6weeks ago for abnormal uterine bleeding and pelvic pain.  Subjective: Patient reports marked improvement in her preop symptoms. Eating a regular diet without difficulty. The patient is not having any pain.  Activity: normal activities of daily living.  Objective: Blood pressure 104/72, pulse 78, height _0  (1.6 m), weight 200 lb (90.7 kg), last menstrual period 02/03/2017.  Physical Exam  Constitutional: She appears well-developed and well-nourished. No distress.  Genitourinary: Vagina normal.  Genitourinary Comments: Vaginal cuff intact, well healed, uterus and cervix surgically absent  HENT:  Head: Normocephalic.  Cardiovascular: Normal rate.  Pulmonary/Chest: No respiratory distress.  Abdominal: Soft. Bowel sounds are normal. She exhibits no distension. There is no tenderness. There is no guarding.  Skin: She is not diaphoretic.    Admission on 02/14/2017, Discharged on 02/15/2017  Component Date Value Ref Range Status  . Preg Test, Ur 02/14/2017 NEGATIVE  NEGATIVE Final   Comment:        THE SENSITIVITY OF THIS METHODOLOGY IS >24 mIU/mL   . ABO/RH(D) 02/14/2017    Final                   Value:A POS Performed at [facility], [address] [place], [place] [address]   . SURGICAL PATHOLOGY 02/14/2017    Final                   Value:Surgical Pathology CASE: [id number] PATIENT: Mauria Corsi Surgical Pathology Report     SPECIMEN SUBMITTED: A. Uterus with cervix, bilateral tubes  CLINICAL HISTORY: None provided  PRE-OPERATIVE DIAGNOSIS: Abnormal uterine bleeding, dysmenorrhea  POST-OPERATIVE DIAGNOSIS: None provided.     DIAGNOSIS: A. UTERUS WITH CERVIX AND BILATERAL FALLOPIAN TUBES; TOTAL HYSTERECTOMY WITH BILATERAL SALPINGECTOMY: - CHRONIC CERVICITIS WITH FOCAL SQUAMOUS METAPLASIA. - ENDOMETRIUM WITH DECIDUALIZED STROMA CONSISTENT WITH  PROGESTIN EFFECT. - MYOMETRIUM WITH ADENOMYOSIS AND INTRAMURAL LEIOMYOMATA, LARGEST MEASURING 2.1 CM. - BILATERAL FALLOPIAN TUBES WITH FIBRIATED END AND BENIGN PARATUBAL CYSTS. - NEGATIVE FOR ATYPIA AND MALIGNANCY.   GROSS DESCRIPTION:  A. The specimen is received in a formalin-filled container labeled with the patient's name and uterus with cervix, bilateral tubes.  Adnexa: attached bilateral fallopian tubes Weight: 104  grams Dime                         nsions:      Fundus -5.5 x 5.2 x 4.1 cm      Cervix -3.4 x 2.6 cm with a 0.5 cm external os Serosa: wrinkled pink-tan Cervix: smooth pink Endocervix: trabecular pink-tan Endometrial cavity:      Dimensions -3.2 x 2.8 Cm      Thickness -up to 0.1 cm      Other findings -red-tan Myometrium:     Thickness -2.0 cm     Other findings -with 0.5 and 2.1 cm in diameter intramural tan whorled nodules in the posterior aspect Adnexa:       Right fallopian tube           Measurements -8.7 cm in length x 1.5 cm in diameter           Other findings -purple tan fimbriated       Left fallopian tube            Measurements -8.5 cm in length x 1.2 cm in diameter  Other findings -purple tan fimbriated Other comments: in formalin  Block summary: 1-representative anterior cervix 2-representative posterior cervix 3-representative anterior endomyometrium 4-representative posterior endomyometrium 5-representative cross-sections and longitudinal fimbriated end right fall                         opian tube 6-representative cross-sections and longitudinal fimbriated end left fallopian tube 7-representative intramural nodules  Final Diagnosis performed by Quay Burow, MD.  Electronically signed 02/15/2017 10:37:42AM    The electronic signature indicates that the named Attending Pathologist has evaluated the specimen  Technical component performed at [facility], [address], [place], [place] [address] Lab: [phone] Dir:  Rush Farmer, MD, MMM  Professional component performed at [facility], [facility], [place], [place], [place] [address] Lab: [phone] Dir: Dellia Nims. Rubinas, MD    . WBC 02/15/2017 13.9* 3.6 - 11.0 K/uL Final  . RBC 02/15/2017 4.43  3.80 - 5.20 MIL/uL Final  . Hemoglobin 02/15/2017 13.1  12.0 - 16.0 g/dL Final  . HCT 02/15/2017 39.8  35.0 - 47.0 % Final  . MCV 02/15/2017 89.8  80.0 - 100.0 fL Final  . MCH 02/15/2017 29.5  26.0 - 34.0 pg Final  . MCHC 02/15/2017 32.8  32.0 - 36.0 g/dL Final  . RDW 02/15/2017 13.7  11.5 - 14.5 % Final  . Platelets 02/15/2017 255  150 - 440 K/uL Final   Performed at [facility], [address], [place], [place] [address]  . Sodium 02/15/2017 137  135 - 145 mmol/L Final  . Potassium 02/15/2017 3.9  3.5 - 5.1 mmol/L Final  . Chloride 02/15/2017 107  101 - 111 mmol/L Final  . CO2 02/15/2017 20* 22 - 32 mmol/L Final  . Glucose, Bld 02/15/2017 128* 65 - 99 mg/dL Final  . BUN 02/15/2017 8  6 - 20 mg/dL Final  . Creatinine, Ser 02/15/2017 0.59  0.44 - 1.00 mg/dL Final  . Calcium 02/15/2017 8.5* 8.9 - 10.3 mg/dL Final  . GFR calc non Af Amer 02/15/2017 >60  >60 mL/min Final  . GFR calc Af Amer 02/15/2017 >60  >60 mL/min Final   Comment: (NOTE) The eGFR has been calculated using the CKD EPI equation. This calculation has not been validated in all clinical situations. eGFR's persistently <60 mL/min signify possible Chronic Kidney Disease.   Georgiann Hahn gap 02/15/2017 10  5 - 15 Final   Performed at [facility], [address] [place], [place] [address]    Assessment: 43 y.o. s/p TLH, BS, and cystoscopy stable  Plan: Patient has done well after surgery with no apparent complications.  I have discussed the post-operative course to date, and the expected progress moving forward.  The patient understands what complications to be concerned about.  I will see the patient in routine follow up, or sooner if  needed.    Activity plan: No restriction.   Malachy Mood, MD, [facility], [facility] 03/29/2017, 9:37 AM

## 2017-08-15 ENCOUNTER — Other Ambulatory Visit: Payer: Self-pay | Admitting: Nurse Practitioner

## 2017-08-15 DIAGNOSIS — Z1231 Encounter for screening mammogram for malignant neoplasm of breast: Secondary | ICD-10-CM

## 2017-09-26 ENCOUNTER — Ambulatory Visit
Admission: RE | Admit: 2017-09-26 | Discharge: 2017-09-26 | Disposition: A | Discharge status: Home / Self Care | Payer: Medicaid Other | Source: Ambulatory Visit | Attending: Nurse Practitioner | Admitting: Nurse Practitioner

## 2017-09-26 DIAGNOSIS — Z1231 Encounter for screening mammogram for malignant neoplasm of breast: Secondary | ICD-10-CM | POA: Diagnosis not present

## 2018-03-07 ENCOUNTER — Emergency Department
Admission: EM | Admit: 2018-03-07 | Discharge: 2018-03-07 | Disposition: A | Discharge status: Home / Self Care | Payer: Medicaid Other | Attending: Emergency Medicine | Admitting: Emergency Medicine

## 2018-03-07 ENCOUNTER — Encounter: Payer: Self-pay | Admitting: Emergency Medicine

## 2018-03-07 DIAGNOSIS — I10 Essential (primary) hypertension: Secondary | ICD-10-CM | POA: Insufficient documentation

## 2018-03-07 DIAGNOSIS — Z79899 Other long term (current) drug therapy: Secondary | ICD-10-CM | POA: Diagnosis not present

## 2018-03-07 DIAGNOSIS — J111 Influenza due to unidentified influenza virus with other respiratory manifestations: Secondary | ICD-10-CM | POA: Diagnosis not present

## 2018-03-07 DIAGNOSIS — R509 Fever, unspecified: Secondary | ICD-10-CM | POA: Diagnosis present

## 2018-03-07 MED ORDER — NAPROXEN 500 MG PO TABS: 500.0000 mg | ORAL_TABLET | Freq: Two times a day (BID) | ORAL | 0 refills | Status: DC

## 2018-03-07 MED ORDER — GUAIFENESIN-CODEINE 100-10 MG/5ML PO SYRP: 5.0000 mL | ORAL_SOLUTION | Freq: Three times a day (TID) | ORAL | 0 refills | Status: DC | PRN

## 2018-03-07 NOTE — ED Provider Notes (Signed)
[facility] Emergency Department Provider Note  ____________________________________________  Time seen: Approximately 4:21 PM  I have reviewed the triage vital signs and the nursing notes.   HISTORY  Chief Complaint Cough; Generalized Body Aches; and Fever   HPI Ashley Rose is a 43 y.o. female who presents to the emergency department for treatment and evaluation of  Fever, cough, congestion, and body aches for the past 2 to 3 days.  Her daughter also has had similar symptoms.  No alleviating measures have been attempted prior to arrival.    Past Medical History:  Diagnosis Date  . ADHD (attention deficit hyperactivity disorder)   . DVT (deep venous thrombosis) (HCC)    Right leg  . Family history of adverse reaction to anesthesia    grandmother had a hard time waking up  . GERD (gastroesophageal reflux disease)   . Headache    migraines  . History of kidney stones   . Hypercholesteremia   . Hypertension   . Seizures ([place])    last seizure at age 38    Patient Active Problem List   Diagnosis Date Noted  . S/P laparoscopic hysterectomy 02/14/2017    Past Surgical History:  Procedure Laterality Date  . CHOLECYSTECTOMY  2004  . COLPOSCOPY  01/2014  . COLPOSCOPY  03/2014  . CYSTOSCOPY N/A 02/14/2017   Procedure: CYSTOSCOPY;  Surgeon: Malachy Mood, MD;  Location: [facility] ORS;  Service: Gynecology;  Laterality: N/A;  . DILATION AND CURETTAGE OF UTERUS  2015  . LAPAROSCOPIC HYSTERECTOMY Bilateral 02/14/2017   Procedure: HYSTERECTOMY TOTAL LAPAROSCOPIC BILATERAL SALPINGECTOMY;  Surgeon: Malachy Mood, MD;  Location: [facility] ORS;  Service: Gynecology;  Laterality: Bilateral;  . LEG SURGERY Bilateral    as a child to correct walking   . TONSILLECTOMY      Prior to Admission medications   Medication Sig Start Date End Date Taking? Authorizing Provider  amphetamine-dextroamphetamine (ADDERALL) 15 MG tablet Take 15 mg by mouth 2 (two) times  daily.   Yes [provider]  atorvastatin (LIPITOR) 20 MG tablet Take 20 mg by mouth daily.   Yes [provider]  cetirizine (ZYRTEC) 10 MG tablet Take 10 mg by mouth daily.   Yes [provider]  Cholecalciferol (VITAMIN D) 2000 units CAPS Take 2,000 Units by mouth daily.    Yes [provider]  ondansetron (ZOFRAN) 4 MG tablet Take 4 mg by mouth every 8 (eight) hours as needed for nausea or vomiting.   Yes [provider]  simvastatin (ZOCOR) 40 MG tablet Take 40 mg by mouth daily at 6 PM.  11/09/14  Yes [provider]  SUMAtriptan (IMITREX) 25 MG tablet Take 25 mg by mouth every 2 (two) hours as needed for migraine. May repeat in 2 hours if headache persists or recurs.   Yes [provider]  calcium carbonate (TUMS - DOSED IN MG ELEMENTAL CALCIUM) 500 MG chewable tablet Chew 2 tablets by mouth daily as needed for indigestion or heartburn.    [provider]  EPINEPHrine 0.3 mg/0.3 mL IJ SOAJ injection Inject 0.3 mg into the muscle as needed.     [provider]  guaiFENesin-codeine (ROBITUSSIN AC) 100-10 MG/5ML syrup Take 5 mLs by mouth 3 (three) times daily as needed for cough. 03/07/18   Brynlynn Walko B, FNP  ibuprofen (ADVIL,MOTRIN) 600 MG tablet Take 1 tablet (600 mg total) by mouth every 6 (six) hours as needed. 02/15/17   Malachy Mood, MD  metoprolol tartrate (LOPRESSOR)  25 MG tablet TAKE 1 TABLET BY MOUTH TWICE DAILY (STOP METOPROLOL SUCCINATE ER 25 MG DAILY) 09/30/16   [provider]  naproxen (NAPROSYN) 500 MG tablet Take 1 tablet (500 mg total) by mouth 2 (two) times daily with a meal. 03/07/18   Grady Mohabir B, FNP  oxyCODONE-acetaminophen (PERCOCET/ROXICET) 5-325 MG tablet Take 1-2 tablets by mouth every 4 (four) hours as needed for moderate pain or severe pain. 02/15/17   Malachy Mood, MD    Allergies Corn-containing products; Milk-related compounds; Penicillins; and Eggshell membrane  (chicken) [egg shells]  Family History  Problem Relation Age of Onset  . Endometriosis Mother   . Breast cancer Neg Hx     Social History Social History   Tobacco Use  . Smoking status: Never Smoker  . Smokeless tobacco: Never Used  Substance Use Topics  . Alcohol use: Yes    Comment: occasionaly  . Drug use: No    Review of Systems Constitutional: Positive for fever/chills.  Normal appetite. ENT: Positive for sore throat. Cardiovascular: Denies chest pain. Respiratory: Negative for shortness of breath.  Positive for cough.  Negative for wheezing.  Gastrointestinal: Positive for nausea, no vomiting.  Positive for diarrhea.  Musculoskeletal: Positive for body aches Skin: Negative for rash. Neurological: Positive for headaches ____________________________________________   PHYSICAL EXAM:  VITAL SIGNS: ED Triage Vitals  Enc Vitals Group     BP 03/07/18 1402 127/83     Pulse Rate 03/07/18 1402 (!) 105     Resp 03/07/18 1402 20     Temp 03/07/18 1402 98.8 F (37.1 C)     Temp Source 03/07/18 1402 Oral     SpO2 03/07/18 1402 98 %     Weight 03/07/18 1359 200 lb (90.7 kg)     Height 03/07/18 1359 5\' 3"  (1.6 m)     Head Circumference --      Peak Flow --      Pain Score 03/07/18 1359 6     Pain Loc --      Pain Edu? --      Excl. in [place]? --     Constitutional: Alert and oriented.  Acutely ill appearing and in no acute distress. Eyes: Conjunctivae are normal. Ears: Bilateral tympanic membranes are injected and mildly erythematous Nose: No sinus congestion noted; no rhinnorhea. Mouth/Throat: Mucous membranes are moist.  Oropharynx erythematous. Tonsils flat and without exudate. Uvula midline. Neck: No stridor.  Lymphatic: Bilateral anterior cervical lymphadenopathy. Cardiovascular: Normal rate, regular rhythm. Good peripheral circulation. Respiratory: Respirations are even and unlabored.  No retractions.  Breath sounds clear to auscultation. Gastrointestinal: Soft  and nontender.  Musculoskeletal: FROM x 4 extremities.  Neurologic:  Normal speech and language. Skin:  Skin is warm, dry and intact. No rash noted. Psychiatric: Mood and affect are normal. Speech and behavior are normal.  ____________________________________________   LABS (all labs ordered are listed, but only abnormal results are displayed)  Labs Reviewed - No data to display ____________________________________________  EKG  Not indicated ____________________________________________  RADIOLOGY  Not indicated ____________________________________________   PROCEDURES  Procedure(s) performed: None  Critical Care performed: No ____________________________________________   INITIAL IMPRESSION / ASSESSMENT AND PLAN / ED COURSE  44 y.o. female presenting to the emergency department for treatment and evaluation of flulike symptoms.  Exam and symptoms are consistent with what is likely influenza B as there is currently widespread influenza in the community.  She will be treated symptomatically with Naprosyn and Robitussin-AC.  She will be provided an excuse  for work as well for the next 3 days.  She was instructed to stay well-hydrated and take Tylenol in addition to the Naprosyn and Robitussin if needed.  She is encouraged to follow-up with her primary care provider if not improving over the week or so.  She was encouraged to return to the emergency department for symptoms change or worsen if unable to schedule an appointment.    Medications - No data to display  ED Discharge Orders         Ordered    guaiFENesin-codeine (ROBITUSSIN AC) 100-10 MG/5ML syrup  3 times daily PRN     03/07/18 1632    naproxen (NAPROSYN) 500 MG tablet  2 times daily with meals     03/07/18 1632           Pertinent labs & imaging results that were available during my care of the patient were reviewed by me and considered in my medical decision making (see chart for details).    If  controlled substance prescribed during this visit, 12 month history viewed on the [place] prior to issuing an initial prescription for Schedule II or III opiod. ____________________________________________   FINAL CLINICAL IMPRESSION(S) / ED DIAGNOSES  Final diagnoses:  Influenza    Note:  This document was prepared using Dragon voice recognition software and may include unintentional dictation errors.     Victorino Dike, FNP 03/07/18 1638    Eula Listen, MD 03/07/18 [id number]

## 2018-03-07 NOTE — ED Triage Notes (Signed)
Pt reports fever, cough, congestion and bodyaches foe the past 2 days.

## 2018-03-07 NOTE — Discharge Instructions (Signed)
Keep well-hydrated.  Take Tylenol in addition to the prescribed medications if needed for body aches or fever.  See your primary care provider if not improving over the next week or so.  Return to the emergency department for symptoms of change or worsen if you are unable to schedule an appointment.

## 2018-03-31 ENCOUNTER — Ambulatory Visit (INDEPENDENT_AMBULATORY_CARE_PROVIDER_SITE_OTHER): Payer: Medicaid Other | Admitting: Obstetrics and Gynecology

## 2018-03-31 ENCOUNTER — Encounter: Payer: Self-pay | Admitting: Obstetrics and Gynecology

## 2018-03-31 VITALS — BP 102/70 | HR 77 | Ht 63.0 in | Wt 211.0 lb

## 2018-03-31 DIAGNOSIS — Z1329 Encounter for screening for other suspected endocrine disorder: Secondary | ICD-10-CM

## 2018-03-31 DIAGNOSIS — Z23 Encounter for immunization: Secondary | ICD-10-CM

## 2018-03-31 DIAGNOSIS — Z1239 Encounter for other screening for malignant neoplasm of breast: Secondary | ICD-10-CM

## 2018-03-31 DIAGNOSIS — Z01419 Encounter for gynecological examination (general) (routine) without abnormal findings: Secondary | ICD-10-CM

## 2018-03-31 DIAGNOSIS — Z0000 Encounter for general adult medical examination without abnormal findings: Secondary | ICD-10-CM

## 2018-03-31 DIAGNOSIS — N951 Menopausal and female climacteric states: Secondary | ICD-10-CM

## 2018-03-31 NOTE — Patient Instructions (Signed)
[facility] [place] [address]  [facility] [place]  [address] [place] [address]  Phone: [phone]  CDC and its Advisory Committee on Immunization Practices have not changed their recommendations regarding egg allergy and receipt of influenza (flu) vaccines. The recommendations remain the same as last season (2016-2017). Based on those recommendations, people with egg allergies no longer need to be observed for an allergic reaction for 30 minutes after receiving a flu vaccine. People with a history of egg allergy of any severity should receive any licensed, recommended, and age-appropriate influenza vaccine.  Those who have a history of severe allergic reaction to egg (i.e., any symptom other than hives) should be vaccinated in an inpatient or outpatient medical setting (including but not necessarily limited to hospitals, clinics, health departments, and physician offices), under the supervision of a health care provider who is able to recognize and manage severe allergic conditions.  Most flu shots and the nasal spray flu vaccine are manufactured" using egg-based technology.  Because of this, they contain a small amount of egg proteins, such as ovalbumin. However, studies that have examined the use of both the nasal spray vaccine and flu shots in egg-allergic and non-egg-allergic patients indicate that severe allergic reactions in people with egg allergies are unlikely. A recent CDC study found the rate of anaphylaxis after all vaccines is 1.31 per one million vaccine doses given.

## 2018-03-31 NOTE — Progress Notes (Signed)
Gynecology Annual Exam  PCP: Danelle Berry, NP  Chief Complaint:  Chief Complaint  Patient presents with  . Gynecologic Exam    History of Present Illness: Patient is a 43 y.o. G1P1001 presents for annual exam. The patient has no complaints today.   LMP: Patient's last menstrual period was 02/03/2017. Absent secondary to prior hysterectomy.  The patient is sexually active. She currently uses status post hysterectomy for contraception. She denies dyspareunia.  The patient does perform self breast exams.  There is no notable family history of breast or ovarian cancer in her family.  The patient wears seatbelts: yes.   The patient has regular exercise: not asked.    The patient denies current symptoms of depression.    Review of Systems: Review of Systems  Constitutional: Positive for diaphoresis and malaise/fatigue. Negative for chills and fever.  HENT: Negative for congestion.   Respiratory: Negative for cough and shortness of breath.   Cardiovascular: Negative for chest pain and palpitations.  Gastrointestinal: Negative for abdominal pain, constipation, diarrhea, heartburn, nausea and vomiting.  Genitourinary: Negative for dysuria, frequency and urgency.  Skin: Negative for itching and rash.  Neurological: Negative for dizziness and headaches.  Endo/Heme/Allergies: Negative for polydipsia.  Psychiatric/Behavioral: Negative for depression.    Past Medical History:  Past Medical History:  Diagnosis Date  . ADHD (attention deficit hyperactivity disorder)   . DVT (deep venous thrombosis) (HCC)    Right leg  . Family history of adverse reaction to anesthesia    grandmother had a hard time waking up  . GERD (gastroesophageal reflux disease)   . Headache    migraines  . History of kidney stones   . Hypercholesteremia   . Hypertension   . Seizures ([place])    last seizure at age 67    Past Surgical History:  Past Surgical History:  Procedure Laterality Date  .  CHOLECYSTECTOMY  2004  . COLPOSCOPY  01/2014  . COLPOSCOPY  03/2014  . CYSTOSCOPY N/A 02/14/2017   Procedure: CYSTOSCOPY;  Surgeon: Malachy Mood, MD;  Location: [facility] ORS;  Service: Gynecology;  Laterality: N/A;  . DILATION AND CURETTAGE OF UTERUS  2015  . LAPAROSCOPIC HYSTERECTOMY Bilateral 02/14/2017   Procedure: HYSTERECTOMY TOTAL LAPAROSCOPIC BILATERAL SALPINGECTOMY;  Surgeon: Malachy Mood, MD;  Location: [facility] ORS;  Service: Gynecology;  Laterality: Bilateral;  . LEG SURGERY Bilateral    as a child to correct walking   . TONSILLECTOMY      Gynecologic History:  Patient's last menstrual period was 02/03/2017. Contraception: status post hysterectomy Last Pap: Results were: N/A status post prior hysterectomy Last mammogram: 09/26/2017 Results were: BI-RAD I  Obstetric History: G1P1001  Family History:  Family History  Problem Relation Age of Onset  . Endometriosis Mother   . Breast cancer Neg Hx     Social History:  Social History   Socioeconomic History  . Marital status: Married    Spouse name: Not on file  . Number of children: Not on file  . Years of education: Not on file  . Highest education level: Not on file  Occupational History  . Not on file  Social Needs  . Financial resource strain: Not on file  . Food insecurity:    Worry: Not on file    Inability: Not on file  . Transportation needs:    Medical: Not on file    Non-medical: Not on file  Tobacco Use  . Smoking status: Never Smoker  . Smokeless tobacco: Never Used  Substance and Sexual Activity  . Alcohol use: Yes    Comment: occasionaly  . Drug use: No  . Sexual activity: Yes    Birth control/protection: Surgical    Comment: Hysterectomy  Lifestyle  . Physical activity:    Days per week: Not on file    Minutes per session: Not on file  . Stress: Not on file  Relationships  . Social connections:    Talks on phone: Not on file    Gets together: Not on file    Attends religious  service: Not on file    Active member of club or organization: Not on file    Attends meetings of clubs or organizations: Not on file    Relationship status: Not on file  . Intimate partner violence:    Fear of current or ex partner: Not on file    Emotionally abused: Not on file    Physically abused: Not on file    Forced sexual activity: Not on file  Other Topics Concern  . Not on file  Social History Narrative  . Not on file    Allergies:  Allergies  Allergen Reactions  . Corn-Containing Products Other (See Comments)    Sinus congestion   . Milk-Related Compounds Other (See Comments)    Sinus congestion   . Penicillins Other (See Comments)    Has patient had a PCN reaction causing immediate rash, facial/tongue/throat swelling, SOB or lightheadedness with hypotension: Unknown Has patient had a PCN reaction causing severe rash involving mucus membranes or skin necrosis: Unknown Has patient had a PCN reaction that required hospitalization: Unknown Has patient had a PCN reaction occurring within the last 10 years: No If all of the above answers are "NO", then may proceed with Cephalosporin use.   . Eggshell Membrane (Chicken) [Egg Shells] Other (See Comments)    Sinus congestion    Medications: Prior to Admission medications   Medication Sig Start Date End Date Taking? Authorizing Provider  amphetamine-dextroamphetamine (ADDERALL) 15 MG tablet Take 15 mg by mouth 2 (two) times daily.   Yes [provider]  atorvastatin (LIPITOR) 20 MG tablet Take 20 mg by mouth daily.   Yes [provider]  calcium carbonate (TUMS - DOSED IN MG ELEMENTAL CALCIUM) 500 MG chewable tablet Chew 2 tablets by mouth daily as needed for indigestion or heartburn.   Yes [provider]  cetirizine (ZYRTEC) 10 MG tablet Take 10 mg by mouth daily.   Yes [provider]  Cholecalciferol (VITAMIN D) 2000 units CAPS Take 2,000 Units by mouth daily.    Yes [provider]  EPINEPHrine 0.3 mg/0.3 mL IJ SOAJ injection Inject 0.3 mg into the muscle as needed.    Yes [provider]  ibuprofen (ADVIL,MOTRIN) 600 MG tablet Take 1 tablet (600 mg total) by mouth every 6 (six) hours as needed. 02/15/17  Yes Malachy Mood, MD  metoprolol tartrate (LOPRESSOR) 25 MG tablet TAKE 1 TABLET BY MOUTH TWICE DAILY (STOP METOPROLOL SUCCINATE ER 25 MG DAILY) 09/30/16  Yes [provider]  ondansetron (ZOFRAN) 4 MG tablet Take 4 mg by mouth every 8 (eight) hours as needed for nausea or vomiting.   Yes [provider]  oxyCODONE-acetaminophen (PERCOCET/ROXICET) 5-325 MG tablet Take 1-2 tablets by mouth every 4 (four) hours as needed for moderate pain or severe pain. 02/15/17  Yes Malachy Mood, MD  SUMAtriptan (IMITREX) 25 MG tablet Take 25 mg by mouth every 2 (two) hours as needed for migraine.  May repeat in 2 hours if headache persists or recurs.   Yes [provider]    Physical Exam Vitals: Blood pressure 102/70, pulse 77, height 5\' 3"  (1.6 m), weight 211 lb (95.7 kg), last menstrual period 02/03/2017.  General: NAD HEENT: normocephalic, anicteric Thyroid: no enlargement, no palpable nodules Pulmonary: No increased work of breathing, CTAB Cardiovascular: RRR, distal pulses 2+ Breast: Breast symmetrical, no tenderness, no palpable nodules or masses, no skin or nipple retraction present, no nipple discharge.  No axillary or supraclavicular lymphadenopathy. Abdomen: NABS, soft, non-tender, non-distended.  Umbilicus without lesions.  No hepatomegaly, splenomegaly or masses palpable. No evidence of hernia  Genitourinary:  External: Normal external female genitalia.  Normal urethral meatus, normal Bartholin's and Skene's glands.    Vagina: Normal vaginal mucosa, no evidence of prolapse.    Cervix: Grossly normal in appearance, no bleeding  Uterus: Non-enlarged, mobile, normal contour.  No CMT  Adnexa: ovaries non-enlarged, no  adnexal masses  Rectal: deferred  Lymphatic: no evidence of inguinal lymphadenopathy Extremities: no edema, erythema, or tenderness Neurologic: Grossly intact Psychiatric: mood appropriate, affect full  Female chaperone present for pelvic and breast  portions of the physical exam    Assessment: 43 y.o. G1P1001 routine annual exam  Plan: Problem List Items Addressed This Visit    None    Visit Diagnoses    Encounter for gynecological examination without abnormal finding    -  Primary   Relevant Orders   FSH   Estradiol   Thyroid Panel With TSH   Breast screening       Relevant Orders   MM 3D SCREEN BREAST BILATERAL   Perimenopausal vasomotor symptoms       Relevant Orders   FSH   Estradiol   Thyroid Panel With TSH   Thyroid disorder screening       Relevant Orders   Thyroid Panel With TSH   Flu vaccine need       Relevant Orders   Flu Vaccine QUAD 36+ mos IM (Completed)      1) Mammogram - recommend yearly screening mammogram.  Mammogram Is up to date   2) STI screening  was notoffered and therefore not obtained  3) ASCCP guidelines and rational discussed.  Patient opts for discontinue secondary to prior hysterectomy screening interval  4) Contraception - the patient is currently using  status post hysterectomy.  She is not currently in need of contraception secondary to being sterile  5) Colonoscopy -- Screening recommended starting at age 35 for average risk individuals, age 86 for individuals deemed at increased risk (including African Americans) and recommended to continue until age 76.  For patient age 40-85 individualized approach is recommended.  Gold standard screening is via colonoscopy, Cologuard screening is an acceptable alternative for patient unwilling or unable to undergo colonoscopy.  "Colorectal cancer screening for average?risk adults: 2018 guideline update from the American Cancer Society"CA: A Cancer Journal for Clinicians: Jun 27, 2016   6)  Routine healthcare maintenance including cholesterol, diabetes screening discussed managed by PCP  7) Vasomotor symptoms - FSH/estradiol, and thyroid panel today  8) Influenza vaccination - CDC and its Advisory Committee on Immunization Practices have not changed their recommendations regarding egg allergy and receipt of influenza (flu) vaccines. The recommendations remain the same as last season (2016-2017). Based on those recommendations, people with egg allergies no longer need to be observed for an allergic reaction for 30 minutes after receiving a flu vaccine. People with a history of egg allergy  of any severity should receive any licensed, recommended, and age-appropriate influenza vaccine.  Those who have a history of severe allergic reaction to egg (i.e., any symptom other than hives) should be vaccinated in an inpatient or outpatient medical setting (including but not necessarily limited to hospitals, clinics, health departments, and physician offices), under the supervision of a health care provider who is able to recognize and manage severe allergic conditions.  Most flu shots and the nasal spray flu vaccine are manufactured" using egg-based technology.  Because of this, they contain a small amount of egg proteins, such as ovalbumin. However, studies that have examined the use of both the nasal spray vaccine and flu shots in egg-allergic and non-egg-allergic patients indicate that severe allergic reactions in people with egg allergies are unlikely. A recent CDC study found the rate of anaphylaxis after all vaccines is 1.31 per one million vaccine doses given.  9) Return in about 1 year (around 03/31/2019) for annual.   Malachy Mood, MD, [place], [facility] 03/31/2018, 9:17 AM

## 2018-04-01 LAB — ESTRADIOL: ESTRADIOL: 99.3 pg/mL

## 2018-04-01 LAB — THYROID PANEL WITH TSH
Free Thyroxine Index: 1.8 (ref 1.2–4.9)
T3 Uptake Ratio: 24 % (ref 24–39)
T4, Total: 7.3 ug/dL (ref 4.5–12.0)
TSH: 3.09 u[IU]/mL (ref 0.450–4.500)

## 2018-04-01 LAB — FOLLICLE STIMULATING HORMONE: FSH: 3.4 m[IU]/mL

## 2018-08-08 ENCOUNTER — Ambulatory Visit: Payer: Medicaid Other | Admitting: Obstetrics and Gynecology

## 2018-08-13 ENCOUNTER — Other Ambulatory Visit: Payer: Self-pay

## 2018-08-13 ENCOUNTER — Ambulatory Visit (INDEPENDENT_AMBULATORY_CARE_PROVIDER_SITE_OTHER): Payer: Medicaid Other | Admitting: Obstetrics and Gynecology

## 2018-08-13 ENCOUNTER — Encounter: Payer: Self-pay | Admitting: Obstetrics and Gynecology

## 2018-08-13 VITALS — BP 134/83 | HR 81 | Ht 63.0 in | Wt 247.0 lb

## 2018-08-13 DIAGNOSIS — N939 Abnormal uterine and vaginal bleeding, unspecified: Secondary | ICD-10-CM | POA: Diagnosis not present

## 2018-08-13 NOTE — Progress Notes (Signed)
Obstetrics & Gynecology Office Visit   Chief Complaint  Patient presents with  . Vaginal Bleeding    red blood on toilet tissue, once last month   History of Present Illness: 43 y.o. G64P1001 female who underwent a TLH/BS on 0/01/7492 that was uncomplicated.  The pathology was negative. She has had no bleeding since her recovery from her hysterectomy until 6/28. She noted bright red blood on tissue after voiding.  She has noted none since then. The only thing she has noticed is a metallic smell.  She denies vaginal itching, burning, irritation.  She denies hematuria, hematochezia.  She denies any vaginal sores.  She is sexually active.  She denies any trauma to the area, including intercourse, that might have caused her bleeding.  She denies fevers and chills.  She denies abdominal pain.  She has normal eating, bowel, and bladder habits.  Apart from the smell, she notes no differences in her discharge.   Past Medical History:  Diagnosis Date  . ADHD (attention deficit hyperactivity disorder)   . DVT (deep venous thrombosis) (HCC)    Right leg  . Family history of adverse reaction to anesthesia    grandmother had a hard time waking up  . GERD (gastroesophageal reflux disease)   . Headache    migraines  . History of kidney stones   . Hypercholesteremia   . Hypertension   . Seizures ([place])    last seizure at age 11    Past Surgical History:  Procedure Laterality Date  . CHOLECYSTECTOMY  2004  . COLPOSCOPY  01/2014  . COLPOSCOPY  03/2014  . CYSTOSCOPY N/A 02/14/2017   Procedure: CYSTOSCOPY;  Surgeon: Malachy Mood, MD;  Location: [facility] OR[facility];  Service: Gynecology;  Laterality: N/A;  . DILATION AND CURETTAGE OF UTERUS  2015  . LAPAROSCOPIC HYSTERECTOMY Bilateral 02/14/2017   Procedure: HYSTERECTOMY TOTAL LAPAROSCOPIC BILATERAL SALPINGECTOMY;  Surgeon: Malachy Mood, MD;  Location: [facility] OR[facility];  Service: Gynecology;  Laterality: Bilateral;  . LEG SURGERY Bilateral    as a child to  correct walking   . TONSILLECTOMY      Gynecologic History: Patient's last menstrual period was 02/03/2017.  Obstetric History: G1P1001  Family History  Problem Relation Age of Onset  . Endometriosis Mother   . Breast cancer Neg Hx     Social History   Socioeconomic History  . Marital status: Divorced    Spouse name: Not on file  . Number of children: Not on file  . Years of education: Not on file  . Highest education level: Not on file  Occupational History  . Not on file  Social Needs  . Financial resource strain: Not on file  . Food insecurity    Worry: Not on file    Inability: Not on file  . Transportation needs    Medical: Not on file    Non-medical: Not on file  Tobacco Use  . Smoking status: Never Smoker  . Smokeless tobacco: Never Used  Substance and Sexual Activity  . Alcohol use: Yes    Comment: occasionaly  . Drug use: No  . Sexual activity: Yes    Birth control/protection: Surgical    Comment: Hysterectomy  Lifestyle  . Physical activity    Days per week: Not on file    Minutes per session: Not on file  . Stress: Not on file  Relationships  . Social Herbalist on phone: Not on file    Gets together: Not on file  Attends religious service: Not on file    Active member of club or organization: Not on file    Attends meetings of clubs or organizations: Not on file    Relationship status: Not on file  . Intimate partner violence    Fear of current or ex partner: Not on file    Emotionally abused: Not on file    Physically abused: Not on file    Forced sexual activity: Not on file  Other Topics Concern  . Not on file  Social History Narrative  . Not on file    Allergies  Allergen Reactions  . Corn-Containing Products Other (See Comments)    Sinus congestion   . Milk-Related Compounds Other (See Comments)    Sinus congestion   . Penicillins Other (See Comments)    Has patient had a PCN reaction causing immediate rash,  facial/tongue/throat swelling, SOB or lightheadedness with hypotension: Unknown Has patient had a PCN reaction causing severe rash involving mucus membranes or skin necrosis: Unknown Has patient had a PCN reaction that required hospitalization: Unknown Has patient had a PCN reaction occurring within the last 10 years: No If all of the above answers are "NO", then may proceed with Cephalosporin use.   . Eggshell Membrane (Chicken) [Egg Shells] Other (See Comments)    Sinus congestion    Prior to Admission medications   Medication Sig Start Date End Date Taking? Authorizing Provider  amphetamine-dextroamphetamine (ADDERALL) 15 MG tablet Take 15 mg by mouth 2 (two) times daily.   Yes [provider]  atorvastatin (LIPITOR) 20 MG tablet Take 20 mg by mouth daily.   Yes [provider]  calcium carbonate (TUMS - DOSED IN MG ELEMENTAL CALCIUM) 500 MG chewable tablet Chew 2 tablets by mouth daily as needed for indigestion or heartburn.   Yes [provider]  cetirizine (ZYRTEC) 10 MG tablet Take 10 mg by mouth daily.   Yes [provider]  Cholecalciferol (VITAMIN D) 2000 units CAPS Take 2,000 Units by mouth daily.    Yes [provider]  EPINEPHrine 0.3 mg/0.3 mL IJ SOAJ injection Inject 0.3 mg into the muscle as needed.    Yes [provider]  ibuprofen (ADVIL,MOTRIN) 600 MG tablet Take 1 tablet (600 mg total) by mouth every 6 (six) hours as needed. 02/15/17  Yes Malachy Mood, MD  metoprolol tartrate (LOPRESSOR) 25 MG tablet TAKE 1 TABLET BY MOUTH TWICE DAILY (STOP METOPROLOL SUCCINATE ER 25 MG DAILY) 09/30/16  Yes [provider]  ondansetron (ZOFRAN) 4 MG tablet Take 4 mg by mouth every 8 (eight) hours as needed for nausea or vomiting.   Yes [provider]  oxyCODONE-acetaminophen (PERCOCET/ROXICET) 5-325 MG tablet Take 1-2 tablets by mouth every 4 (four) hours as needed for moderate pain or severe pain. 02/15/17  Yes  Malachy Mood, MD  SUMAtriptan (IMITREX) 25 MG tablet Take 25 mg by mouth every 2 (two) hours as needed for migraine. May repeat in 2 hours if headache persists or recurs.   Yes [provider]    Review of Systems  Constitutional: Negative.   HENT: Negative.   Eyes: Negative.   Respiratory: Negative.   Cardiovascular: Negative.   Gastrointestinal: Negative.   Genitourinary: Negative.   Musculoskeletal: Negative.   Skin: Negative.   Neurological: Negative.   Psychiatric/Behavioral: Negative.      Physical Exam BP 134/83 (BP Location: Left Arm, Patient Position: Sitting, Cuff Size: Large)   Pulse 81   Ht 5\' 3"  (1.6  m)   Wt 247 lb (112 kg)   LMP 02/03/2017   BMI 43.75 kg/m  Patient's last menstrual period was 02/03/2017. Physical Exam Constitutional:      General: She is not in acute distress.    Appearance: Normal appearance. She is well-developed.  Genitourinary:     Pelvic exam was performed with patient in the lithotomy position.     Vulva, inguinal canal, urethra, bladder, vagina, right adnexa and left adnexa normal.     No posterior fourchette tenderness, injury or lesion present.     No signs of injury or lesions in the vagina.     No vaginal discharge, tenderness, bleeding, ulceration or atrophic mucosa.     Cervix is absent.     Uterus is absent.  HENT:     Head: Normocephalic and atraumatic.  Eyes:     General: No scleral icterus.    Conjunctiva/sclera: Conjunctivae normal.  Neck:     Musculoskeletal: Normal range of motion and neck supple.  Cardiovascular:     Rate and Rhythm: Normal rate and regular rhythm.     Heart sounds: No murmur. No friction rub. No gallop.   Pulmonary:     Effort: Pulmonary effort is normal. No respiratory distress.     Breath sounds: Normal breath sounds. No wheezing or rales.  Abdominal:     General: Bowel sounds are normal. There is no distension.     Palpations: Abdomen is soft. There is no mass.     Tenderness:  There is no abdominal tenderness. There is no guarding or rebound.  Musculoskeletal: Normal range of motion.  Neurological:     General: No focal deficit present.     Mental Status: She is alert and oriented to person, place, and time.     Cranial Nerves: No cranial nerve deficit.  Skin:    General: Skin is warm and dry.     Findings: No erythema.  Psychiatric:        Mood and Affect: Mood normal.        Behavior: Behavior normal.        Judgment: Judgment normal.    Female chaperone present for pelvic and breast  portions of the physical exam  Assessment: 43 y.o. G4P1001 female here for  1. Vaginal bleeding      Plan: Problem List Items Addressed This Visit    None    Visit Diagnoses    Vaginal bleeding    -  Primary     No evidence of vaginal bleeding today nor source of prior vaginal bleeding identified.  Patient is reassured.  She was instructed to return to clinic if bleeding continues, especially while bleeding is active so that a source could be identified.  15 minutes spent in face to face discussion with > 50% spent in counseling,management, and coordination of care of her vaginal bleeding.  Prentice Docker, MD 08/13/2018 10:51 AM

## 2018-09-29 ENCOUNTER — Ambulatory Visit
Admission: RE | Admit: 2018-09-29 | Discharge: 2018-09-29 | Disposition: A | Discharge status: Home / Self Care | Payer: Medicaid Other | Source: Ambulatory Visit | Attending: Obstetrics and Gynecology | Admitting: Obstetrics and Gynecology

## 2018-09-29 DIAGNOSIS — Z1231 Encounter for screening mammogram for malignant neoplasm of breast: Secondary | ICD-10-CM | POA: Insufficient documentation

## 2018-09-29 DIAGNOSIS — Z1239 Encounter for other screening for malignant neoplasm of breast: Secondary | ICD-10-CM

## 2018-12-02 ENCOUNTER — Emergency Department: Payer: Medicaid Other

## 2018-12-02 ENCOUNTER — Encounter: Payer: Self-pay | Admitting: Emergency Medicine

## 2018-12-02 ENCOUNTER — Emergency Department
Admission: EM | Admit: 2018-12-02 | Discharge: 2018-12-02 | Disposition: A | Discharge status: Other Acute Inpatient Hospital | Payer: Medicaid Other | Attending: Emergency Medicine | Admitting: Emergency Medicine

## 2018-12-02 ENCOUNTER — Other Ambulatory Visit: Payer: Self-pay

## 2018-12-02 DIAGNOSIS — S52601A Unspecified fracture of lower end of right ulna, initial encounter for closed fracture: Secondary | ICD-10-CM | POA: Insufficient documentation

## 2018-12-02 DIAGNOSIS — S92122A Displaced fracture of body of left talus, initial encounter for closed fracture: Secondary | ICD-10-CM

## 2018-12-02 DIAGNOSIS — S82142A Displaced bicondylar fracture of left tibia, initial encounter for closed fracture: Secondary | ICD-10-CM

## 2018-12-02 DIAGNOSIS — S92354A Nondisplaced fracture of fifth metatarsal bone, right foot, initial encounter for closed fracture: Secondary | ICD-10-CM | POA: Diagnosis not present

## 2018-12-02 DIAGNOSIS — S52501A Unspecified fracture of the lower end of right radius, initial encounter for closed fracture: Secondary | ICD-10-CM | POA: Insufficient documentation

## 2018-12-02 DIAGNOSIS — Y999 Unspecified external cause status: Secondary | ICD-10-CM | POA: Insufficient documentation

## 2018-12-02 DIAGNOSIS — Y929 Unspecified place or not applicable: Secondary | ICD-10-CM | POA: Insufficient documentation

## 2018-12-02 DIAGNOSIS — R000 Tachycardia, unspecified: Secondary | ICD-10-CM | POA: Insufficient documentation

## 2018-12-02 DIAGNOSIS — S99912A Unspecified injury of left ankle, initial encounter: Secondary | ICD-10-CM | POA: Diagnosis present

## 2018-12-02 DIAGNOSIS — S92025A Nondisplaced fracture of anterior process of left calcaneus, initial encounter for closed fracture: Secondary | ICD-10-CM | POA: Diagnosis not present

## 2018-12-02 DIAGNOSIS — S82842A Displaced bimalleolar fracture of left lower leg, initial encounter for closed fracture: Secondary | ICD-10-CM

## 2018-12-02 DIAGNOSIS — S301XXA Contusion of abdominal wall, initial encounter: Secondary | ICD-10-CM | POA: Diagnosis not present

## 2018-12-02 DIAGNOSIS — S92345A Nondisplaced fracture of fourth metatarsal bone, left foot, initial encounter for closed fracture: Secondary | ICD-10-CM | POA: Insufficient documentation

## 2018-12-02 DIAGNOSIS — Y939 Activity, unspecified: Secondary | ICD-10-CM | POA: Diagnosis not present

## 2018-12-02 DIAGNOSIS — Z20828 Contact with and (suspected) exposure to other viral communicable diseases: Secondary | ICD-10-CM | POA: Insufficient documentation

## 2018-12-02 DIAGNOSIS — R519 Headache, unspecified: Secondary | ICD-10-CM | POA: Insufficient documentation

## 2018-12-02 DIAGNOSIS — S92022A Displaced fracture of anterior process of left calcaneus, initial encounter for closed fracture: Secondary | ICD-10-CM

## 2018-12-02 DIAGNOSIS — S82845A Nondisplaced bimalleolar fracture of left lower leg, initial encounter for closed fracture: Secondary | ICD-10-CM | POA: Insufficient documentation

## 2018-12-02 DIAGNOSIS — S92302A Fracture of unspecified metatarsal bone(s), left foot, initial encounter for closed fracture: Secondary | ICD-10-CM

## 2018-12-02 LAB — URINALYSIS, COMPLETE (UACMP) WITH MICROSCOPIC
Bacteria, UA: NONE SEEN
Bilirubin Urine: NEGATIVE
Glucose, UA: NEGATIVE mg/dL
Ketones, ur: 20 mg/dL — AB
Leukocytes,Ua: NEGATIVE
Nitrite: NEGATIVE
Protein, ur: 100 mg/dL — AB
RBC / HPF: >50 RBC/hpf — ABNORMAL HIGH (ref 0–5)
Specific Gravity, Urine: 1.024 (ref 1.005–1.030)
pH: 5 (ref 5.0–8.0)

## 2018-12-02 LAB — CBC WITH DIFFERENTIAL/PLATELET
Abs Immature Granulocytes: 0.17 10*3/uL — ABNORMAL HIGH (ref 0.00–0.07)
Basophils Absolute: 0.1 10*3/uL (ref 0.0–0.1)
Basophils Relative: 1 %
Eosinophils Absolute: 0.1 10*3/uL (ref 0.0–0.5)
Eosinophils Relative: 1 %
HCT: 39.9 % (ref 36.0–46.0)
Hemoglobin: 13 g/dL (ref 12.0–15.0)
Immature Granulocytes: 1 %
Lymphocytes Relative: 13 %
Lymphs Abs: 2 10*3/uL (ref 0.7–4.0)
MCH: 29.4 pg (ref 26.0–34.0)
MCHC: 32.6 g/dL (ref 30.0–36.0)
MCV: 90.3 fL (ref 80.0–100.0)
Monocytes Absolute: 1 10*3/uL (ref 0.1–1.0)
Monocytes Relative: 7 %
Neutro Abs: 11.8 10*3/uL — ABNORMAL HIGH (ref 1.7–7.7)
Neutrophils Relative %: 77 %
Platelets: 240 10*3/uL (ref 150–400)
RBC: 4.42 MIL/uL (ref 3.87–5.11)
RDW: 14.5 % (ref 11.5–15.5)
WBC: 15.1 10*3/uL — ABNORMAL HIGH (ref 4.0–10.5)
nRBC: 0 % (ref 0.0–0.2)

## 2018-12-02 LAB — BASIC METABOLIC PANEL
Anion gap: 15 (ref 5–15)
BUN: 10 mg/dL (ref 6–20)
CO2: 21 mmol/L — ABNORMAL LOW (ref 22–32)
Calcium: 9.6 mg/dL (ref 8.9–10.3)
Chloride: 99 mmol/L (ref 98–111)
Creatinine, Ser: 0.64 mg/dL (ref 0.44–1.00)
GFR calc Af Amer: >60 mL/min (ref >60)
GFR calc non Af Amer: >60 mL/min (ref >60)
Glucose, Bld: 145 mg/dL — ABNORMAL HIGH (ref 70–99)
Potassium: 3.6 mmol/L (ref 3.5–5.1)
Sodium: 135 mmol/L (ref 135–145)

## 2018-12-02 MED ORDER — ONDANSETRON HCL 4 MG/2ML IJ SOLN
4.0000 mg | Freq: Once | INTRAMUSCULAR | Status: AC
  Administered 2018-12-02: 4 mg via INTRAVENOUS

## 2018-12-02 MED ORDER — MORPHINE SULFATE (PF) 4 MG/ML IV SOLN
INTRAVENOUS | Status: AC
  Filled 2018-12-02: qty 1

## 2018-12-02 MED ORDER — MORPHINE SULFATE (PF) 4 MG/ML IV SOLN
4.0000 mg | Freq: Once | INTRAVENOUS | Status: AC
  Administered 2018-12-02: 4 mg via INTRAVENOUS

## 2018-12-02 MED ORDER — HYDROMORPHONE HCL 1 MG/ML IJ SOLN
1.0000 mg | Freq: Once | INTRAMUSCULAR | Status: AC
  Administered 2018-12-02: 1 mg via INTRAVENOUS
  Filled 2018-12-02: qty 1

## 2018-12-02 MED ORDER — LORAZEPAM 2 MG/ML IJ SOLN
1.0000 mg | Freq: Once | INTRAMUSCULAR | Status: AC
  Administered 2018-12-02: 1 mg via INTRAVENOUS
  Filled 2018-12-02: qty 1

## 2018-12-02 MED ORDER — SODIUM CHLORIDE 0.9 % IV BOLUS
1000.0000 mL | Freq: Once | INTRAVENOUS | Status: AC
  Administered 2018-12-02: 16:00:00 1000 mL via INTRAVENOUS

## 2018-12-02 MED ORDER — ONDANSETRON HCL 4 MG/2ML IJ SOLN
INTRAMUSCULAR | Status: AC
  Filled 2018-12-02: qty 2

## 2018-12-02 MED ORDER — MORPHINE SULFATE (PF) 4 MG/ML IV SOLN
4.0000 mg | Freq: Once | INTRAVENOUS | Status: AC
  Administered 2018-12-02: 4 mg via INTRAVENOUS
  Filled 2018-12-02: qty 1

## 2018-12-02 MED ORDER — IOHEXOL 300 MG/ML  SOLN
100.0000 mL | Freq: Once | INTRAMUSCULAR | Status: AC | PRN
  Administered 2018-12-02: 100 mL via INTRAVENOUS
  Filled 2018-12-02: qty 100
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
## 2018-12-02 NOTE — ED Notes (Signed)
Patient transported to CT 
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
## 2018-12-02 NOTE — ED Provider Notes (Signed)
Apolonio Schneiders, attending physician, personally viewed and interpreted this EKG  EKG Time: 1617 Rate: 106 Rhythm: sinus tachycardia Axis: normal Intervals: qtc 494 QRS: narrow ST changes: no st elevation Impression: abnormal Val Eagle, MD 12/02/18 1623

## 2018-12-03 LAB — SARS CORONAVIRUS 2 (TAT 6-24 HRS): SARS Coronavirus 2: NEGATIVE

## 2019-04-23 ENCOUNTER — Other Ambulatory Visit: Payer: Self-pay

## 2019-04-23 ENCOUNTER — Encounter: Payer: Self-pay | Admitting: Physical Therapy

## 2019-04-23 ENCOUNTER — Ambulatory Visit: Payer: Medicaid Other | Attending: Orthopedic Surgery | Admitting: Physical Therapy

## 2019-04-23 DIAGNOSIS — M79662 Pain in left lower leg: Secondary | ICD-10-CM | POA: Insufficient documentation

## 2019-04-23 DIAGNOSIS — M6281 Muscle weakness (generalized): Secondary | ICD-10-CM | POA: Diagnosis present

## 2019-04-23 DIAGNOSIS — R262 Difficulty in walking, not elsewhere classified: Secondary | ICD-10-CM | POA: Insufficient documentation

## 2019-04-23 NOTE — Therapy (Signed)
[facility] [address] [place], [place], [address] Phone: [phone]   Fax:  [phone]  Physical Therapy Evaluation  Patient Details  Name: Ashley Rose MRN: [id number] Date of Birth: 28-Jan-1976 Referring Provider (PT): Durenda Hurt   Encounter Date: 04/23/2019  PT End of Session - 04/23/19 1836    Visit Number  1    Number of Visits  17    Date for PT Re-Evaluation  06/18/19    Authorization Type  CAID    PT Start Time  1100    PT Stop Time  1155    PT Time Calculation (min)  55 min    Equipment Utilized During Treatment  Gait belt    Activity Tolerance  Patient tolerated treatment well;Patient limited by pain;Patient limited by fatigue    Behavior During Therapy  [facility] for tasks assessed/performed       Past Medical History:  Diagnosis Date  . ADHD (attention deficit hyperactivity disorder)   . DVT (deep venous thrombosis) (HCC)    Right leg  . Family history of adverse reaction to anesthesia    grandmother had a hard time waking up  . GERD (gastroesophageal reflux disease)   . Headache    migraines  . History of kidney stones   . Hypercholesteremia   . Hypertension   . Seizures ([place])    last seizure at age 57    Past Surgical History:  Procedure Laterality Date  . CHOLECYSTECTOMY  2004  . COLPOSCOPY  01/2014  . COLPOSCOPY  03/2014  . CYSTOSCOPY N/A 02/14/2017   Procedure: CYSTOSCOPY;  Surgeon: Malachy Mood, MD;  Location: [facility] ORS;  Service: Gynecology;  Laterality: N/A;  . DILATION AND CURETTAGE OF UTERUS  2015  . LAPAROSCOPIC HYSTERECTOMY Bilateral 02/14/2017   Procedure: HYSTERECTOMY TOTAL LAPAROSCOPIC BILATERAL SALPINGECTOMY;  Surgeon: Malachy Mood, MD;  Location: [facility] ORS;  Service: Gynecology;  Laterality: Bilateral;  . LEG SURGERY Bilateral    as a child to correct walking   . TONSILLECTOMY      There were no vitals filed for this visit.       [facility] PT Assessment -  04/23/19 0001      Assessment   Medical Diagnosis  Closed fx of L tibial plateau    Referring Provider (PT)  Durenda Hurt    Onset Date/Surgical Date  12/02/18    Hand Dominance  Right      Restrictions   Weight Bearing Restrictions  Yes    LLE Weight Bearing  Weight bearing as tolerated      Balance Screen   Has the patient fallen in the past 6 months  No      Standardized Balance Assessment   Standardized Balance Assessment  Five Times Sit to Stand;Berg Balance Test    Five times sit to stand comments   29.8 seconds, BUE use      Berg Balance Test   Sit to Stand  Able to stand  independently using hands    Standing Unsupported  Able to stand 2 minutes with supervision    Sitting with Back Unsupported but Feet Supported on Floor or Stool  Able to sit safely and securely 2 minutes    Stand to Sit  Controls descent by using hands    Transfers  Able to transfer with verbal cueing and /or supervision    Standing Unsupported with Eyes Closed  Able to stand 10 seconds with supervision  Standing Unsupported with Feet Together  Needs help to attain position and unable to hold for 15 seconds    From Standing, Reach Forward with Outstretched Arm  Can reach forward >12 cm safely (5")    From Standing Position, Pick up Object from Floor  Unable to try/needs assist to keep balance    From Standing Position, Turn to Look Behind Over each Shoulder  Looks behind one side only/other side shows less weight shift    Turn 360 Degrees  Needs assistance while turning    Standing Unsupported, Alternately Place Feet on Step/Stool  Needs assistance to keep from falling or unable to try    Standing Unsupported, One Foot in Front  Needs help to step but can hold 15 seconds    Standing on One Leg  Unable to try or needs assist to prevent fall    Total Score  25       SUBJECTIVE  Chief complaint: Patient notes that she would like to walk and work on standing more. She is able to stand with support  at kitchen counter for 10-15 min. She is able to stand from the chair with use of BUE. Patient notes RUE is continuing to heal. 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 Patient is able to perform dressing and showering in sitting position. Patient resides in a single floor apartment that is wheelchair accessible. Patient notes prior to accident she was ambulatory without AD with occasional knee pain. Patient notes she can weight bear fully on LLE in static standing. Sleeping is interrupted by pain. 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 Patient notes she has the "dropsies" in her L foot.   History: Per MD note: "Ashley Rose is a 44 year old female who sustained multiple injuries. She had a right radial styloid fracture treated nonoperatively which is now in a lace up brace. She also had a split depression of her lateral left tibial plateau fracture and a supination/adduction fracture of the left ankle. We did the surgery on 12/08/2018 and she has not done much in the way of physical therapy or walking as she says her fianc does not want her up and walking so she is mostly been in a wheelchair. She takes Naprosyn and she ran out of her gabapentin. She would like to restart physical therapy."  Precautions/WB Restrictions: WBAT   Pain frequency: Constant Pain quality: pain quality: sharp and shooting (knee) Radiating pain: Yes into the ankle Numbness/Tingling: No Pain Score: Present 3/10, Best 3/10, Worst 10/10 Aggravating factors: standing; walking Easing factors: medication, rest,  24 hour pain behavior: increases over the course of the day  Prior history of ankle injury, surgery, or pain: Yes History of back, hip, or knee pain: Yes  Goals for therapy: Return to walking; more balance  Red Flags (fever/chills, dysarthria, dysphagia, bowel and bladder changes, recent weight loss/gain, personal history of cancer, night pain): No   OBJECTIVE  MUSCULOSKELETAL: Tremor:  Absent Bulk: Normal Tone: Normal, no clonus No trophic changes noted to foot/ankle. No ecchymosis, erythema, or edema noted. No gross ankle/foot deformity noted  Posture No gross abnormalities noted in seated or standing posture; decreased WB on LLE. WBAT with scale feedback: patient unable to WB 50% on LLE; average of several rounds ~30% WB on LLE with BUE support.  Gait In // bars: decreased WB on LLE with decreased stride length B (R>L), decreased stance time on LLE, limited foot clearance B RW, decreased WB on LLE with  decreased stride length B (R>L), decreased stance time on LLE, limited foot clearance B  Palpation No pain to palpation along medial and lateral malleoli. No pain over anterior or posterior ankle. Achilles tendon intact and painless to palpation  Strength R/L 5/4 Hip flexion 5/5 Hip abduction (seated) 5/5 Hip adduction (seated) 5/3 Knee extension 5/3+ Knee flexion 5/3 Ankle Plantarflexion 5/3 Ankle Dorsiflexion 5/3- Ankle Inversion 5/3- Ankle Eversion  AROM R/L -2/5 Knee extension WFL/104 Knee flexion WFL/5 Ankle Plantarflexion WFL/0 Ankle Dorsiflexion  NEUROLOGICAL:  Mental Status Patient is oriented to person, place and time.  Recent memory is intact.  Remote memory is intact.  Attention span and concentration are intact.  Expressive speech is intact.  Patient's fund of knowledge is within normal limits for educational level.  Sensation Grossly intact to light touch bilateral LEs as determined by testing dermatomes L2-S2 Proprioception and hot/cold testing deferred on this date  Reflexes R/L 2+/2+ Knee Jerk (L3/4) 2+/2+ Ankle Jerk (S1/2)  VASCULAR Dorsalis pedis and posterior tibial pulses are palpable     ASSESSMENT Patient is a 44 year old presenting to clinic with chief complaints of difficulty walking and standing s/p multiple fractures in LLE. Upon examination, patient demonstrates deficits in pain, posture, gait, strength,  balance, and LLE ROM as evidenced by gross LLE strength 3+/5, 10/10 worst pain, rounded spine and decreased lumbar lordosis, 25/56 on Berg Balance Assessment, 5TSTS 29.8 sec with BUE support, and lackign 5 degrees of L knee extension. Patient's responses on FOTO outcome measures (51) indicate moderate functional limitations/disability/distress. Patient's progress may be limited due to transportation/resources; however, patient's motivation is advantageous. Patient was able to achieve 50% WB on LLE with visual (via scale) and verbal feedback during today's evaluation and responded positively to active interventions. Patient will benefit from continued skilled therapeutic intervention to address deficits in pain, posture, gait, strength, balance, and LLE ROM in order to increase function, and improve overall QOL.     Objective measurements completed on examination: See above findings.      PT Long Term Goals - 04/23/19 1901      PT LONG TERM GOAL #1   Title  Patient will be independent with HEP in order to decrease ankle pain and increase strength in order to improve pain-free function at home and work.    Time  8    Period  Weeks    Status  New    Target Date  06/18/19      PT LONG TERM GOAL #2   Title  Patient will demonstrate improved function as evidenced by a score of 64 on FOTO measure for full participation in activities at home and in the community.    Baseline  IE: 51    Time  8    Period  Weeks    Status  New    Target Date  06/18/19      PT LONG TERM GOAL #3   Title  Patient will decrease worst pain as reported on NPRS by at least 3 points in order to demonstrate clinically significant reduction in ankle/foot pain.    Baseline  IE: 10    Time  8    Period  Weeks    Status  New    Target Date  06/18/19      PT LONG TERM GOAL #4   Title  Patient will improve BERG by at least 3 points in order to demonstrate clinically significant improvement in balance.    Baseline  IE:  25/56    Time  8    Period  Weeks    Status  New    Target Date  06/18/19      PT LONG TERM GOAL #5   Title  Patient will decrease 5TSTS by at least 3 seconds without UE support nor compensatory patterns in order to demonstrate clinically significant improvement in LE strength.    Baseline  IE: 29.8 s BUE support    Time  8    Period  Weeks    Status  New    Target Date  06/18/19             Plan - 04/23/19 1837    Clinical Impression Statement  Patient is a 44 year old presenting to clinic with chief complaints of difficulty walking and standing s/p multiple fractures in LLE. Upon examination, patient demonstrates deficits in pain, posture, gait, strength, balance, and LLE ROM as evidenced by gross LLE strength 3+/5, 10/10 worst pain, rounded spine and decreased lumbar lordosis, 25/56 on Berg Balance Assessment, 5TSTS 29.8 sec with BUE support, and lackign 5 degrees of L knee extension. Patient's responses on FOTO outcome measures (51) indicate moderate functional limitations/disability/distress. Patient's progress may be limited due to transportation/resources; however, patient's motivation is advantageous. Patient was able to achieve 50% WB on LLE with visual (via scale) and verbal feedback during today's evaluation and responded positively to active interventions. Patient will benefit from continued skilled therapeutic intervention to address deficits in pain, posture, gait, strength, balance, and LLE ROM in order to increase function, and improve overall QOL.    Personal Factors and Comorbidities  Age;Education;Sex;Comorbidity 3+;Fitness;Past/Current Experience;Social Background;Finances;Transportation    Comorbidities  ADHD, HTN, hyperlipidemia, migraines, adjustment disorder    Examination-Activity Limitations  Bathing;Dressing;Transfers;Bed Mobility;Lift;Squat;Stairs;Locomotion Level;Stand    Examination-Participation Restrictions  Dentist;Yard  Work;Laundry;Cleaning;Community Activity;Shop;Meal Prep    Stability/Clinical Decision Making  Evolving/Moderate complexity    Clinical Decision Making  Moderate    Rehab Potential  Good    PT Frequency  2x / week    PT Duration  8 weeks    PT Treatment/Interventions  ADLs/Self Care Home Management;Cryotherapy;Electrical Stimulation;Moist Heat;Gait training;Stair training;Functional mobility training;Therapeutic activities;Neuromuscular re-education;Balance training;Therapeutic exercise;Patient/family education;Orthotic Fit/Training;Scar mobilization;Manual techniques;Dry needling;Splinting;Taping;Joint Manipulations;Spinal Manipulations;Passive range of motion    PT Next Visit Plan  LLE WB exposure; static/dynamic balance; gait training    PT Home Exercise Plan  continue with prescribed HEP from [place] and Agree with Plan of Care  Patient       Patient will benefit from skilled therapeutic intervention in order to improve the following deficits and impairments:  Abnormal gait, Decreased balance, Decreased endurance, Decreased mobility, Difficulty walking, Hypomobility, Improper body mechanics, Pain, Impaired flexibility, Increased fascial restricitons, Decreased strength, Decreased safety awareness, Decreased coordination, Decreased activity tolerance, Decreased range of motion, Decreased knowledge of precautions  Visit Diagnosis: Difficulty in walking, not elsewhere classified  Muscle weakness (generalized)  Pain in left lower leg     Problem List Patient Active Problem List   Diagnosis Date Noted  . S/P laparoscopic hysterectomy 02/14/2017   Myles Gip PT, DPT [phone] 04/23/2019, 7:05 PM  [facility] [address] [place], [place], [address] Phone: [phone]   Fax:  [phone]  Name: Ashley Rose MRN: [id number] Date of Birth: [birth year]

## 2019-04-29 ENCOUNTER — Ambulatory Visit: Payer: Medicaid Other | Admitting: Physical Therapy

## 2019-04-29 ENCOUNTER — Encounter: Payer: Self-pay | Admitting: Physical Therapy

## 2019-04-29 ENCOUNTER — Other Ambulatory Visit: Payer: Self-pay

## 2019-04-29 DIAGNOSIS — M79662 Pain in left lower leg: Secondary | ICD-10-CM

## 2019-04-29 DIAGNOSIS — R262 Difficulty in walking, not elsewhere classified: Secondary | ICD-10-CM | POA: Diagnosis not present

## 2019-04-29 DIAGNOSIS — M6281 Muscle weakness (generalized): Secondary | ICD-10-CM

## 2019-04-29 NOTE — Therapy (Signed)
[facility] [place] [address] [place], [place], [address] Phone: [phone]   Fax:  [phone]  Physical Therapy Treatment  Patient Details  Name: Ashley Rose MRN: [id number] Date of Birth: 02-20-1975 Referring Provider (PT): Durenda Hurt   Encounter Date: 04/29/2019  PT End of Session - 04/29/19 0945    Visit Number  2    Number of Visits  17    Date for PT Re-Evaluation  06/18/19    Authorization Type  CAID    PT Start Time  0940    PT Stop Time  [id number]    PT Time Calculation (min)  55 min    Equipment Utilized During Treatment  Gait belt    Activity Tolerance  Patient tolerated treatment well;Patient limited by pain;Patient limited by fatigue    Behavior During Therapy  [facility] for tasks assessed/performed       Past Medical History:  Diagnosis Date  . ADHD (attention deficit hyperactivity disorder)   . DVT (deep venous thrombosis) (HCC)    Right leg  . Family history of adverse reaction to anesthesia    grandmother had a hard time waking up  . GERD (gastroesophageal reflux disease)   . Headache    migraines  . History of kidney stones   . Hypercholesteremia   . Hypertension   . Seizures ([place])    last seizure at age 34    Past Surgical History:  Procedure Laterality Date  . CHOLECYSTECTOMY  2004  . COLPOSCOPY  01/2014  . COLPOSCOPY  03/2014  . CYSTOSCOPY N/A 02/14/2017   Procedure: CYSTOSCOPY;  Surgeon: Malachy Mood, MD;  Location: [facility] ORS;  Service: Gynecology;  Laterality: N/A;  . DILATION AND CURETTAGE OF UTERUS  2015  . LAPAROSCOPIC HYSTERECTOMY Bilateral 02/14/2017   Procedure: HYSTERECTOMY TOTAL LAPAROSCOPIC BILATERAL SALPINGECTOMY;  Surgeon: Malachy Mood, MD;  Location: [facility] ORS;  Service: Gynecology;  Laterality: Bilateral;  . LEG SURGERY Bilateral    as a child to correct walking   . TONSILLECTOMY      There were no vitals filed for this visit.  Subjective Assessment - 04/29/19 0943     Subjective  Patient notes that she was able to use a regular toilet without help. Patient notes she had increased pain the day after. Patient notes sleep is interrupted by B knee pain.    Currently in Pain?  Yes    Pain Score  5     Pain Location  Knee    Pain Orientation  Left;Right    Pain Descriptors / Indicators  Aching      TREATMENT  Pre-treatment assessment: pain 5/10  Neuromuscular Re-education: // bars: standing weight shifts to L 5 sec 2x10  Therapeutic Exercise: NuStep, seat 8, L0, x5 min for improved tolerance to WB and activity, VCs for increased LLE activation STS, BUE support, 2x5 SBA/CGA Seated hip abduction, GTB, BLE, 2x15 Seated LAQ unweighted, LLE for pain modulation  Post-treatment assessment: pain 5/10  Patient educated throughout session on appropriate technique and form using multi-modal cueing, HEP, and activity modification. Patient articulated understanding and returned demonstration.  Patient Response to interventions: Patient reporting ability to incorporate new exercise to HEP.  ASSESSMENT Patient presents to clinic with excellent motivation to participate in therapy. Patient demonstrates deficits in pain, posture, gait, strength, balance, and LLE ROM. Patient able to achieve increased LLE WB with RLE TDWB in weight shifts with UE support during today's session and responded positively to active interventions.  Patient will benefit from continued skilled therapeutic intervention to address remaining deficits in pain, posture, gait, strength, balance, and LLE ROM in order to increase function and improve overall QOL.    PT Long Term Goals - 04/23/19 1901      PT LONG TERM GOAL #1   Title  Patient will be independent with HEP in order to decrease ankle pain and increase strength in order to improve pain-free function at home and work.    Time  8    Period  Weeks    Status  New    Target Date  06/18/19      PT LONG TERM GOAL #2   Title  Patient  will demonstrate improved function as evidenced by a score of 64 on FOTO measure for full participation in activities at home and in the community.    Baseline  IE: 51    Time  8    Period  Weeks    Status  New    Target Date  06/18/19      PT LONG TERM GOAL #3   Title  Patient will decrease worst pain as reported on NPRS by at least 3 points in order to demonstrate clinically significant reduction in ankle/foot pain.    Baseline  IE: 10    Time  8    Period  Weeks    Status  New    Target Date  06/18/19      PT LONG TERM GOAL #4   Title  Patient will improve BERG by at least 3 points in order to demonstrate clinically significant improvement in balance.    Baseline  IE: 25/56    Time  8    Period  Weeks    Status  New    Target Date  06/18/19      PT LONG TERM GOAL #5   Title  Patient will decrease 5TSTS by at least 3 seconds without UE support nor compensatory patterns in order to demonstrate clinically significant improvement in LE strength.    Baseline  IE: 29.8 s BUE support    Time  8    Period  Weeks    Status  New    Target Date  06/18/19            Plan - 04/29/19 0945    Clinical Impression Statement  Patient presents to clinic with excellent motivation to participate in therapy. Patient demonstrates deficits in pain, posture, gait, strength, balance, and LLE ROM. Patient able to achieve increased LLE WB with RLE TDWB in weight shifts with UE support during today's session and responded positively to active interventions. Patient will benefit from continued skilled therapeutic intervention to address remaining deficits in pain, posture, gait, strength, balance, and LLE ROM in order to increase function and improve overall QOL.    Personal Factors and Comorbidities  Age;Education;Sex;Comorbidity 3+;Fitness;Past/Current Experience;Social Background;Finances;Transportation    Comorbidities  ADHD, HTN, hyperlipidemia, migraines, adjustment disorder     Examination-Activity Limitations  Bathing;Dressing;Transfers;Bed Mobility;Lift;Squat;Stairs;Locomotion Level;Stand    Examination-Participation Restrictions  Dentist;Yard Work;Laundry;Cleaning;Community Activity;Shop;Meal Prep    Stability/Clinical Decision Making  Evolving/Moderate complexity    Rehab Potential  Good    PT Frequency  2x / week    PT Duration  8 weeks    PT Treatment/Interventions  ADLs/Self Care Home Management;Cryotherapy;Electrical Stimulation;Moist Heat;Gait training;Stair training;Functional mobility training;Therapeutic activities;Neuromuscular re-education;Balance training;Therapeutic exercise;Patient/family education;Orthotic Fit/Training;Scar mobilization;Manual techniques;Dry needling;Splinting;Taping;Joint Manipulations;Spinal Manipulations;Passive range of motion    PT Next Visit Plan  LLE WB exposure; static/dynamic balance; gait training    PT Home Exercise Plan  continue with prescribed HEP from [place] and Agree with Plan of Care  Patient       Patient will benefit from skilled therapeutic intervention in order to improve the following deficits and impairments:  Abnormal gait, Decreased balance, Decreased endurance, Decreased mobility, Difficulty walking, Hypomobility, Improper body mechanics, Pain, Impaired flexibility, Increased fascial restricitons, Decreased strength, Decreased safety awareness, Decreased coordination, Decreased activity tolerance, Decreased range of motion, Decreased knowledge of precautions  Visit Diagnosis: Difficulty in walking, not elsewhere classified  Muscle weakness (generalized)  Pain in left lower leg     Problem List Patient Active Problem List   Diagnosis Date Noted  . S/P laparoscopic hysterectomy 02/14/2017   Myles Gip PT, DPT [phone] 04/29/2019, 10:44 AM  [place] [facility] [address] [place], [place], [address] Phone: [phone]   Fax:   [phone]  Name: KRISTANA EBERLY MRN: [id number] Date of Birth: April 17, 1975

## 2019-05-04 ENCOUNTER — Ambulatory Visit: Payer: Medicaid Other | Admitting: Physical Therapy

## 2019-05-06 ENCOUNTER — Other Ambulatory Visit: Payer: Self-pay

## 2019-05-06 ENCOUNTER — Ambulatory Visit (INDEPENDENT_AMBULATORY_CARE_PROVIDER_SITE_OTHER): Payer: Medicaid Other | Admitting: Obstetrics and Gynecology

## 2019-05-06 ENCOUNTER — Encounter: Payer: Self-pay | Admitting: Obstetrics and Gynecology

## 2019-05-06 VITALS — BP 138/84 | Ht 63.0 in

## 2019-05-06 DIAGNOSIS — E669 Obesity, unspecified: Secondary | ICD-10-CM | POA: Diagnosis not present

## 2019-05-06 DIAGNOSIS — Z1339 Encounter for screening examination for other mental health and behavioral disorders: Secondary | ICD-10-CM

## 2019-05-06 DIAGNOSIS — Z1331 Encounter for screening for depression: Secondary | ICD-10-CM

## 2019-05-06 DIAGNOSIS — Z993 Dependence on wheelchair: Secondary | ICD-10-CM

## 2019-05-06 DIAGNOSIS — Z01419 Encounter for gynecological examination (general) (routine) without abnormal findings: Secondary | ICD-10-CM

## 2019-05-06 DIAGNOSIS — Z90710 Acquired absence of both cervix and uterus: Secondary | ICD-10-CM

## 2019-05-06 DIAGNOSIS — Z6841 Body Mass Index (BMI) 40.0 and over, adult: Secondary | ICD-10-CM

## 2019-05-06 DIAGNOSIS — Z0000 Encounter for general adult medical examination without abnormal findings: Secondary | ICD-10-CM
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
## 2019-05-07 ENCOUNTER — Encounter: Payer: Self-pay | Admitting: Physical Therapy

## 2019-05-07 ENCOUNTER — Ambulatory Visit: Payer: Medicaid Other | Attending: Orthopedic Surgery | Admitting: Physical Therapy

## 2019-05-07 DIAGNOSIS — R262 Difficulty in walking, not elsewhere classified: Secondary | ICD-10-CM | POA: Insufficient documentation

## 2019-05-07 DIAGNOSIS — M6281 Muscle weakness (generalized): Secondary | ICD-10-CM | POA: Insufficient documentation

## 2019-05-07 DIAGNOSIS — M79662 Pain in left lower leg: Secondary | ICD-10-CM | POA: Insufficient documentation
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
## 2019-05-12 ENCOUNTER — Encounter: Payer: Self-pay | Admitting: Physical Therapy

## 2019-05-12 ENCOUNTER — Ambulatory Visit: Payer: Medicaid Other | Admitting: Physical Therapy

## 2019-05-12 ENCOUNTER — Other Ambulatory Visit: Payer: Self-pay

## 2019-05-12 DIAGNOSIS — R262 Difficulty in walking, not elsewhere classified: Secondary | ICD-10-CM | POA: Diagnosis not present

## 2019-05-12 DIAGNOSIS — M79662 Pain in left lower leg: Secondary | ICD-10-CM

## 2019-05-12 DIAGNOSIS — M6281 Muscle weakness (generalized): Secondary | ICD-10-CM

## 2019-05-12 NOTE — Therapy (Signed)
[facility] [address] [place], [place], [address] Phone: [phone]   Fax:  [phone]  Physical Therapy Treatment  Patient Details  Name: Ashley Rose MRN: [id number] Date of Birth: [birth year] Referring Provider (PT): Durenda Hurt   Encounter Date: 05/12/2019  PT End of Session - 05/12/19 1256    Visit Number  4    Number of Visits  17    Date for PT Re-Evaluation  06/18/19    Authorization Type  CAID    PT Start Time  1255    PT Stop Time  1350    PT Time Calculation (min)  55 min    Equipment Utilized During Treatment  Gait belt    Activity Tolerance  Patient tolerated treatment well    Behavior During Therapy  [facility] for tasks assessed/performed       Past Medical History:  Diagnosis Date  . ADHD (attention deficit hyperactivity disorder)   . DVT (deep venous thrombosis) (HCC)    Right leg  . Family history of adverse reaction to anesthesia    grandmother had a hard time waking up  . GERD (gastroesophageal reflux disease)   . Headache    migraines  . History of kidney stones   . Hypercholesteremia   . Hypertension   . Seizures ([place])    last seizure at age 76    Past Surgical History:  Procedure Laterality Date  . CHOLECYSTECTOMY  2004  . COLPOSCOPY  01/2014  . COLPOSCOPY  03/2014  . CYSTOSCOPY N/A 02/14/2017   Procedure: CYSTOSCOPY;  Surgeon: Malachy Mood, MD;  Location: [facility] ORS;  Service: Gynecology;  Laterality: N/A;  . DILATION AND CURETTAGE OF UTERUS  2015  . LAPAROSCOPIC HYSTERECTOMY Bilateral 02/14/2017   Procedure: HYSTERECTOMY TOTAL LAPAROSCOPIC BILATERAL SALPINGECTOMY;  Surgeon: Malachy Mood, MD;  Location: [facility] OR[facility];  Service: Gynecology;  Laterality: Bilateral;  . LEG SURGERY Bilateral    as a child to correct walking   . TONSILLECTOMY      There were no vitals filed for this visit.  Subjective Assessment - 05/12/19 1309    Subjective  Patient reports that she has been  walking more at home, but feels she overdid it last Thursday with walking. She notes she was laid up for 2 days after. She was able to walk more on Sunday without consequence. She notes excellent motivation for physical therapy today.    Currently in Pain?  Yes    Pain Score  5     Pain Location  Knee    Pain Orientation  Left;Right       TREATMENT Neuromuscular Re-education: // bars: standing weight shifts to L 5 sec 2x10 // bars: tandem stance, LLE, x1 min hold with faded UE support // bars: gait with BUE support, VCs and TCs for posture and heel strike on LLE Seated LLE hamstring stretch with 1st met head lift for decreased spasticity in LLE and improved TKE Gait with RW, x75 feet, x25 feet with CGA/SBA and w/c follow. Patient requiring VCs for posture and stride length.  Therapeutic Exercise: NuStep, seat 8, L3, x10 min for improved tolerance to WB and activity, VCs for increased LLE activation STS, BUE support, x5 SBA/CGA Seated LLE LAQ unweighted with assisted eccentric lower Seated LLE heel slides with hold and eccentric return for improved LLE motor control  Post-treatment assessment: pain 5/10  Patient educated throughout session on appropriate technique and form using multi-modal cueing, HEP, and activity modification.  Patient articulated understanding and returned demonstration.  Patient Response to interventions: Patient notes increased fatigue but denies concern for overworking.  ASSESSMENT Patient presents to clinic with excellent motivation to participate in therapy. Patient demonstrates deficits in pain, posture, gait, strength, balance, and LLE ROM. Patient able to achieve increased LLE WB gait with RW in clinic during today's session and responded positively to active interventions. Patient will benefit from continued skilled therapeutic intervention to address remaining deficits in pain, posture, gait, strength, balance, and LLE ROM in order to increase function and  improve overall QOL.    PT Long Term Goals - 05/07/19 1506      PT LONG TERM GOAL #1   Title  Patient will be independent with HEP in order to decrease ankle pain and increase strength in order to improve pain-free function at home and work.    Baseline  4/8: modified IND    Time  8    Period  Weeks    Status  New    Target Date  06/18/19      PT LONG TERM GOAL #2   Title  Patient will demonstrate improved function as evidenced by a score of 64 on FOTO measure for full participation in activities at home and in the community.    Baseline  IE: 51; 4/8: 51    Time  8    Period  Weeks    Status  On-going    Target Date  06/18/19      PT LONG TERM GOAL #3   Title  Patient will decrease worst pain as reported on NPRS by at least 3 points in order to demonstrate clinically significant reduction in ankle/foot pain.    Baseline  IE: 10; 4/8: 10    Time  8    Period  Weeks    Status  On-going    Target Date  06/18/19      PT LONG TERM GOAL #4   Title  Patient will improve BERG by at least 3 points in order to demonstrate clinically significant improvement in balance.    Baseline  IE: 25/56; 4/8: 25/56    Time  8    Period  Weeks    Status  On-going    Target Date  06/18/19      PT LONG TERM GOAL #5   Title  Patient will decrease 5TSTS by at least 3 seconds without UE support nor compensatory patterns in order to demonstrate clinically significant improvement in LE strength.    Baseline  IE: 29.8 s BUE support; 4/8: 29.8 s BUE support    Time  8    Period  Weeks    Status  On-going    Target Date  06/18/19            Plan - 05/12/19 1256    Clinical Impression Statement  Patient presents to clinic with excellent motivation to participate in therapy. Patient demonstrates deficits in pain, posture, gait, strength, balance, and LLE ROM. Patient able to achieve increased LLE WB gait with RW in clinic during today's session and responded positively to active interventions.  Patient will benefit from continued skilled therapeutic intervention to address remaining deficits in pain, posture, gait, strength, balance, and LLE ROM in order to increase function and improve overall QOL.    Personal Factors and Comorbidities  Age;Education;Sex;Comorbidity 3+;Fitness;Past/Current Experience;Social Background;Finances;Transportation    Comorbidities  ADHD, HTN, hyperlipidemia, migraines, adjustment disorder    Examination-Activity Limitations  Bathing;Dressing;Transfers;Bed Mobility;Lift;Squat;Stairs;Locomotion Level;Stand  Examination-Participation Restrictions  Personal Finances;Yard Work;Laundry;Cleaning;Community Activity;Shop;Meal Prep    Stability/Clinical Decision Making  Evolving/Moderate complexity    Rehab Potential  Good    PT Frequency  2x / week    PT Duration  8 weeks    PT Treatment/Interventions  ADLs/Self Care Home Management;Cryotherapy;Electrical Stimulation;Moist Heat;Gait training;Stair training;Functional mobility training;Therapeutic activities;Neuromuscular re-education;Balance training;Therapeutic exercise;Patient/family education;Orthotic Fit/Training;Scar mobilization;Manual techniques;Dry needling;Splinting;Taping;Joint Manipulations;Spinal Manipulations;Passive range of motion    PT Next Visit Plan  LLE WB exposure; static/dynamic balance; gait training    PT Home Exercise Plan  continue with prescribed HEP from [place] and Agree with Plan of Care  Patient       Patient will benefit from skilled therapeutic intervention in order to improve the following deficits and impairments:  Abnormal gait, Decreased balance, Decreased endurance, Decreased mobility, Difficulty walking, Hypomobility, Improper body mechanics, Pain, Impaired flexibility, Increased fascial restricitons, Decreased strength, Decreased safety awareness, Decreased coordination, Decreased activity tolerance, Decreased range of motion, Decreased knowledge of precautions  Visit  Diagnosis: Difficulty in walking, not elsewhere classified  Muscle weakness (generalized)  Pain in left lower leg     Problem List Patient Active Problem List   Diagnosis Date Noted  . S/P laparoscopic hysterectomy 02/14/2017   Myles Gip PT, DPT [phone] 05/12/2019, 6:38 PM  [facility] [address] [place], [place], [address] Phone: [phone]   Fax:  [phone]  Name: Ashley Rose MRN: [id number] Date of Birth: 09-17-1975

## 2019-05-14 ENCOUNTER — Other Ambulatory Visit: Payer: Self-pay

## 2019-05-14 ENCOUNTER — Encounter: Payer: Self-pay | Admitting: Physical Therapy

## 2019-05-14 ENCOUNTER — Ambulatory Visit: Payer: Medicaid Other | Admitting: Physical Therapy

## 2019-05-14 DIAGNOSIS — R262 Difficulty in walking, not elsewhere classified: Secondary | ICD-10-CM

## 2019-05-14 DIAGNOSIS — M79662 Pain in left lower leg: Secondary | ICD-10-CM

## 2019-05-14 DIAGNOSIS — M6281 Muscle weakness (generalized): Secondary | ICD-10-CM

## 2019-05-14 NOTE — Therapy (Signed)
[facility] [address] [place], [place], [address] Phone: [phone]   Fax:  [phone]  Physical Therapy Treatment  Patient Details  Name: Ashley Rose MRN: [id number] Date of Birth: [birth year] Referring Provider (PT): Durenda Hurt   Encounter Date: 05/14/2019  PT End of Session - 05/14/19 1347    Visit Number  5    Number of Visits  17    Date for PT Re-Evaluation  06/18/19    Authorization Type  CAID    PT Start Time  1350    PT Stop Time  1445    PT Time Calculation (min)  55 min    Equipment Utilized During Treatment  Gait belt    Activity Tolerance  Patient tolerated treatment well    Behavior During Therapy  [facility] for tasks assessed/performed       Past Medical History:  Diagnosis Date  . ADHD (attention deficit hyperactivity disorder)   . DVT (deep venous thrombosis) (HCC)    Right leg  . Family history of adverse reaction to anesthesia    grandmother had a hard time waking up  . GERD (gastroesophageal reflux disease)   . Headache    migraines  . History of kidney stones   . Hypercholesteremia   . Hypertension   . Seizures ([place])    last seizure at age 35    Past Surgical History:  Procedure Laterality Date  . CHOLECYSTECTOMY  2004  . COLPOSCOPY  01/2014  . COLPOSCOPY  03/2014  . CYSTOSCOPY N/A 02/14/2017   Procedure: CYSTOSCOPY;  Surgeon: Malachy Mood, MD;  Location: [facility] ORS;  Service: Gynecology;  Laterality: N/A;  . DILATION AND CURETTAGE OF UTERUS  2015  . LAPAROSCOPIC HYSTERECTOMY Bilateral 02/14/2017   Procedure: HYSTERECTOMY TOTAL LAPAROSCOPIC BILATERAL SALPINGECTOMY;  Surgeon: Malachy Mood, MD;  Location: [facility] ORS;  Service: Gynecology;  Laterality: Bilateral;  . LEG SURGERY Bilateral    as a child to correct walking   . TONSILLECTOMY      There were no vitals filed for this visit.  Subjective Assessment - 05/14/19 1408    Subjective  Patient notes her knee is tender  but not bruised. She does note some increased L GT pain as a result of the fall on Tuesday. Patient reports she feels the session on Tuesday was good despite falling out of her w/c after the session.    Currently in Pain?  Yes    Pain Score  6        TREATMENT Neuromuscular Re-education: Seated LLE hamstring stretch with 1st met head lift for decreased spasticity in LLE and improved TKE Gait with RW, x75 feet, x60 feet, x25 feet with CGA/SBA and w/c follow. Patient requiring VCs for posture and stride length.  Therapeutic Exercise: NuStep, seat 8, L3, x10 min for improved tolerance to WB and activity, VCs for increased LLE activation STS, BUE support, 3x5 SBA/CGA Seated LLE LAQ RTB, x20 Seated LLE hamstring curl RTB x20   Patient educated throughout session on appropriate technique and form using multi-modal cueing, HEP, and activity modification. Patient articulated understanding and returned demonstration.  Patient Response to interventions: Patient denies any concerns.  ASSESSMENT Patient presents to clinic with excellent motivation to participate in therapy. Patient demonstrates deficits in pain, posture, gait, strength, balance, and LLE ROM. Patient ambulating increased distances with RW in clinic and achieving mildly increased stride length on LLE during today's session and responded positively to active interventions. Patient will benefit  from continued skilled therapeutic intervention to address remaining deficits in pain, posture, gait, strength, balance, and LLE ROM in order to increase function and improve overall QOL.    PT Long Term Goals - 05/07/19 1506      PT LONG TERM GOAL #1   Title  Patient will be independent with HEP in order to decrease ankle pain and increase strength in order to improve pain-free function at home and work.    Baseline  4/8: modified IND    Time  8    Period  Weeks    Status  New    Target Date  06/18/19      PT LONG TERM GOAL #2   Title   Patient will demonstrate improved function as evidenced by a score of 64 on FOTO measure for full participation in activities at home and in the community.    Baseline  IE: 51; 4/8: 51    Time  8    Period  Weeks    Status  On-going    Target Date  06/18/19      PT LONG TERM GOAL #3   Title  Patient will decrease worst pain as reported on NPRS by at least 3 points in order to demonstrate clinically significant reduction in ankle/foot pain.    Baseline  IE: 10; 4/8: 10    Time  8    Period  Weeks    Status  On-going    Target Date  06/18/19      PT LONG TERM GOAL #4   Title  Patient will improve BERG by at least 3 points in order to demonstrate clinically significant improvement in balance.    Baseline  IE: 25/56; 4/8: 25/56    Time  8    Period  Weeks    Status  On-going    Target Date  06/18/19      PT LONG TERM GOAL #5   Title  Patient will decrease 5TSTS by at least 3 seconds without UE support nor compensatory patterns in order to demonstrate clinically significant improvement in LE strength.    Baseline  IE: 29.8 s BUE support; 4/8: 29.8 s BUE support    Time  8    Period  Weeks    Status  On-going    Target Date  06/18/19            Plan - 05/14/19 1347    Clinical Impression Statement  Patient presents to clinic with excellent motivation to participate in therapy. Patient demonstrates deficits in pain, posture, gait, strength, balance, and LLE ROM. Patient ambulating increased distances with RW in clinic and achieving mildly increased stride length on LLE during today's session and responded positively to active interventions. Patient will benefit from continued skilled therapeutic intervention to address remaining deficits in pain, posture, gait, strength, balance, and LLE ROM in order to increase function and improve overall QOL.    Personal Factors and Comorbidities  Age;Education;Sex;Comorbidity 3+;Fitness;Past/Current Experience;Social  Background;Finances;Transportation    Comorbidities  ADHD, HTN, hyperlipidemia, migraines, adjustment disorder    Examination-Activity Limitations  Bathing;Dressing;Transfers;Bed Mobility;Lift;Squat;Stairs;Locomotion Level;Stand    Examination-Participation Restrictions  Dentist;Yard Work;Laundry;Cleaning;Community Activity;Shop;Meal Prep    Stability/Clinical Decision Making  Evolving/Moderate complexity    Rehab Potential  Good    PT Frequency  2x / week    PT Duration  8 weeks    PT Treatment/Interventions  ADLs/Self Care Home Management;Cryotherapy;Electrical Stimulation;Moist Heat;Gait training;Stair training;Functional mobility training;Therapeutic activities;Neuromuscular re-education;Balance training;Therapeutic exercise;Patient/family education;Orthotic Fit/Training;Scar  mobilization;Manual techniques;Dry needling;Splinting;Taping;Joint Manipulations;Spinal Manipulations;Passive range of motion    PT Next Visit Plan  LLE WB exposure; static/dynamic balance; gait training    PT Home Exercise Plan  continue with prescribed HEP from [place] and Agree with Plan of Care  Patient       Patient will benefit from skilled therapeutic intervention in order to improve the following deficits and impairments:  Abnormal gait, Decreased balance, Decreased endurance, Decreased mobility, Difficulty walking, Hypomobility, Improper body mechanics, Pain, Impaired flexibility, Increased fascial restricitons, Decreased strength, Decreased safety awareness, Decreased coordination, Decreased activity tolerance, Decreased range of motion, Decreased knowledge of precautions  Visit Diagnosis: Difficulty in walking, not elsewhere classified  Muscle weakness (generalized)  Pain in left lower leg     Problem List Patient Active Problem List   Diagnosis Date Noted  . S/P laparoscopic hysterectomy 02/14/2017   Myles Gip PT, DPT [phone]  05/14/2019, 4:21 PM  [facility] [address] [place], [place], [address] Phone: [phone]   Fax:  [phone]  Name: Ashley Rose MRN: [id number] Date of Birth: 10-02-1975

## 2019-05-19 ENCOUNTER — Encounter: Payer: Self-pay | Admitting: Physical Therapy

## 2019-05-19 ENCOUNTER — Other Ambulatory Visit: Payer: Self-pay

## 2019-05-19 ENCOUNTER — Ambulatory Visit: Payer: Medicaid Other | Admitting: Physical Therapy

## 2019-05-19 DIAGNOSIS — M6281 Muscle weakness (generalized): Secondary | ICD-10-CM

## 2019-05-19 DIAGNOSIS — R262 Difficulty in walking, not elsewhere classified: Secondary | ICD-10-CM | POA: Diagnosis not present

## 2019-05-19 DIAGNOSIS — M79662 Pain in left lower leg: Secondary | ICD-10-CM

## 2019-05-19 NOTE — Therapy (Signed)
[facility] [address] [place], [place], [address] Phone: [phone]   Fax:  [phone]  Physical Therapy Treatment  Patient Details  Name: Ashley Rose MRN: [id number] Date of Birth: 10-14-1975 Referring Provider (PT): Durenda Hurt   Encounter Date: 05/19/2019  PT End of Session - 05/19/19 1310    Visit Number  6    Number of Visits  17    Date for PT Re-Evaluation  06/18/19    Authorization Type  CAID    PT Start Time  1255    PT Stop Time  1350    PT Time Calculation (min)  55 min    Equipment Utilized During Treatment  Gait belt    Activity Tolerance  Patient tolerated treatment well    Behavior During Therapy  [facility] for tasks assessed/performed       Past Medical History:  Diagnosis Date  . ADHD (attention deficit hyperactivity disorder)   . DVT (deep venous thrombosis) (HCC)    Right leg  . Family history of adverse reaction to anesthesia    grandmother had a hard time waking up  . GERD (gastroesophageal reflux disease)   . Headache    migraines  . History of kidney stones   . Hypercholesteremia   . Hypertension   . Seizures ([place])    last seizure at age 57    Past Surgical History:  Procedure Laterality Date  . CHOLECYSTECTOMY  2004  . COLPOSCOPY  01/2014  . COLPOSCOPY  03/2014  . CYSTOSCOPY N/A 02/14/2017   Procedure: CYSTOSCOPY;  Surgeon: Malachy Mood, MD;  Location: [facility]S;  Service: Gynecology;  Laterality: N/A;  . DILATION AND CURETTAGE OF UTERUS  2015  . LAPAROSCOPIC HYSTERECTOMY Bilateral 02/14/2017   Procedure: HYSTERECTOMY TOTAL LAPAROSCOPIC BILATERAL SALPINGECTOMY;  Surgeon: Malachy Mood, MD;  Location: [facility];  Service: Gynecology;  Laterality: Bilateral;  . LEG SURGERY Bilateral    as a child to correct walking   . TONSILLECTOMY       Subjective Assessment - 05/19/19 1313    Subjective  Patient reports having a nice weekend with "some" walking. Patient also reports  a near miss with respect to a fall in the bathroom/kitchen of which she was able to recover.    Currently in Pain?  Yes    Pain Score  5     Pain Location  Knee    Pain Orientation  Right;Left       TREATMENT Neuromuscular Re-education: Gait with RW, 2x75 feet, x30 feet with CGA/SBA and w/c follow. Patient requiring VCs for posture and stride length. Performed with RW and elevated seating surface:  Standing weight shifts, bilateral, for improved tolerance to LLE WB w/o the presence of hamstring spasm  Standing marches, LLE, x20 for improved foot clearance with gait  Standing LLE WB w/ RLE heel lift, 2x10 for improved LLE WB w/o the presence of hamstring spasm  Standing LLE WB w/ RLE toe taps, 2x10 for improved LLE WB w/o the presence of hamstring spasm  Standing LLE WB w/ RLE mini march, x10 for improved LLE WB w/o the presence of hamstring spasm  Therapeutic Exercise: NuStep, seat 8, L3, x10 min for improved tolerance to WB and activity, VCs for increased LLE activation STS, SUE support from elevated surface, 2x5 SBA/CGA Seated BLE LAQ 1.5# CW , x20 Seated BLE hip flexion march 1.5# CW x20   Patient educated throughout session on appropriate technique and form using multi-modal cueing,  HEP, and activity modification. Patient articulated understanding and returned demonstration.  Patient Response to interventions: Patient denies increased pain and ambulating with increased stride length bilaterally with last bout of gait to waiting room.  ASSESSMENT Patient presents to clinic with excellent motivation to participate in therapy. Patient demonstrates deficits in pain, posture, gait, strength, balance, and LLE ROM. Patient able to ambulate with step-through gait pattern bilaterally with RW during today's session and responded positively to active interventions. Patient will benefit from continued skilled therapeutic intervention to address remaining deficits in pain, posture, gait,  strength, balance, and LLE ROM in order to increase function and improve overall QOL.       PT Long Term Goals - 05/07/19 1506      PT LONG TERM GOAL #1   Title  Patient will be independent with HEP in order to decrease ankle pain and increase strength in order to improve pain-free function at home and work.    Baseline  4/8: modified IND    Time  8    Period  Weeks    Status  New    Target Date  06/18/19      PT LONG TERM GOAL #2   Title  Patient will demonstrate improved function as evidenced by a score of 64 on FOTO measure for full participation in activities at home and in the community.    Baseline  IE: 51; 4/8: 51    Time  8    Period  Weeks    Status  On-going    Target Date  06/18/19      PT LONG TERM GOAL #3   Title  Patient will decrease worst pain as reported on NPRS by at least 3 points in order to demonstrate clinically significant reduction in ankle/foot pain.    Baseline  IE: 10; 4/8: 10    Time  8    Period  Weeks    Status  On-going    Target Date  06/18/19      PT LONG TERM GOAL #4   Title  Patient will improve BERG by at least 3 points in order to demonstrate clinically significant improvement in balance.    Baseline  IE: 25/56; 4/8: 25/56    Time  8    Period  Weeks    Status  On-going    Target Date  06/18/19      PT LONG TERM GOAL #5   Title  Patient will decrease 5TSTS by at least 3 seconds without UE support nor compensatory patterns in order to demonstrate clinically significant improvement in LE strength.    Baseline  IE: 29.8 s BUE support; 4/8: 29.8 s BUE support    Time  8    Period  Weeks    Status  On-going    Target Date  06/18/19            Plan - 05/19/19 1311    Clinical Impression Statement  Patient presents to clinic with excellent motivation to participate in therapy. Patient demonstrates deficits in pain, posture, gait, strength, balance, and LLE ROM. Patient able to ambulate with step-through gait pattern bilaterally  with RW during today's session and responded positively to active interventions. Patient will benefit from continued skilled therapeutic intervention to address remaining deficits in pain, posture, gait, strength, balance, and LLE ROM in order to increase function and improve overall QOL.    Personal Factors and Comorbidities  Age;Education;Sex;Comorbidity 3+;Fitness;Past/Current Experience;Social Background;Finances;Transportation    Comorbidities  ADHD,  HTN, hyperlipidemia, migraines, adjustment disorder    Examination-Activity Limitations  Bathing;Dressing;Transfers;Bed Mobility;Lift;Squat;Stairs;Locomotion Level;Stand    Examination-Participation Restrictions  Dentist;Yard Work;Laundry;Cleaning;Community Activity;Shop;Meal Prep    Stability/Clinical Decision Making  Evolving/Moderate complexity    Rehab Potential  Good    PT Frequency  2x / week    PT Duration  8 weeks    PT Treatment/Interventions  ADLs/Self Care Home Management;Cryotherapy;Electrical Stimulation;Moist Heat;Gait training;Stair training;Functional mobility training;Therapeutic activities;Neuromuscular re-education;Balance training;Therapeutic exercise;Patient/family education;Orthotic Fit/Training;Scar mobilization;Manual techniques;Dry needling;Splinting;Taping;Joint Manipulations;Spinal Manipulations;Passive range of motion    PT Next Visit Plan  LLE WB exposure; static/dynamic balance; gait training    PT Home Exercise Plan  continue with prescribed HEP from [place] and Agree with Plan of Care  Patient       Patient will benefit from skilled therapeutic intervention in order to improve the following deficits and impairments:  Abnormal gait, Decreased balance, Decreased endurance, Decreased mobility, Difficulty walking, Hypomobility, Improper body mechanics, Pain, Impaired flexibility, Increased fascial restricitons, Decreased strength, Decreased safety awareness, Decreased coordination, Decreased activity  tolerance, Decreased range of motion, Decreased knowledge of precautions  Visit Diagnosis: Difficulty in walking, not elsewhere classified  Muscle weakness (generalized)  Pain in left lower leg     Problem List Patient Active Problem List   Diagnosis Date Noted  . S/P laparoscopic hysterectomy 02/14/2017   Myles Gip PT, DPT [phone]  05/19/2019, 2:03 PM  [facility] [address] [place], [place], [address] Phone: [phone]   Fax:  [phone]  Name: KA GAYDON MRN: [id number] Date of Birth: 1975/10/16

## 2019-05-21 ENCOUNTER — Encounter: Payer: Medicaid Other | Admitting: Physical Therapy

## 2019-05-26 ENCOUNTER — Other Ambulatory Visit: Payer: Self-pay

## 2019-05-26 ENCOUNTER — Ambulatory Visit: Payer: Medicaid Other | Admitting: Physical Therapy

## 2019-05-26 ENCOUNTER — Encounter: Payer: Self-pay | Admitting: Physical Therapy

## 2019-05-26 DIAGNOSIS — M6281 Muscle weakness (generalized): Secondary | ICD-10-CM

## 2019-05-26 DIAGNOSIS — R262 Difficulty in walking, not elsewhere classified: Secondary | ICD-10-CM | POA: Diagnosis not present

## 2019-05-26 DIAGNOSIS — M79662 Pain in left lower leg: Secondary | ICD-10-CM

## 2019-05-26 NOTE — Therapy (Signed)
[facility] [address] [place], [place], [address] Phone: [phone]   Fax:  [phone]  Physical Therapy Treatment  Patient Details  Name: Ashley Rose MRN: [id number] Date of Birth: [birth year] Referring Provider (PT): Durenda Hurt   Encounter Date: 05/26/2019  PT End of Session - 05/26/19 1256    Visit Number  7    Number of Visits  17    Date for PT Re-Evaluation  06/18/19    Authorization Type  CAID    PT Start Time  1300    PT Stop Time  1355    PT Time Calculation (min)  55 min    Equipment Utilized During Treatment  Gait belt    Activity Tolerance  Patient tolerated treatment well    Behavior During Therapy  [facility] for tasks assessed/performed       Past Medical History:  Diagnosis Date  . ADHD (attention deficit hyperactivity disorder)   . DVT (deep venous thrombosis) (HCC)    Right leg  . Family history of adverse reaction to anesthesia    grandmother had a hard time waking up  . GERD (gastroesophageal reflux disease)   . Headache    migraines  . History of kidney stones   . Hypercholesteremia   . Hypertension   . Seizures ([place])    last seizure at age 53    Past Surgical History:  Procedure Laterality Date  . CHOLECYSTECTOMY  2004  . COLPOSCOPY  01/2014  . COLPOSCOPY  03/2014  . CYSTOSCOPY N/A 02/14/2017   Procedure: CYSTOSCOPY;  Surgeon: Malachy Mood, MD;  Location: [facility];  Service: Gynecology;  Laterality: N/A;  . DILATION AND CURETTAGE OF UTERUS  2015  . LAPAROSCOPIC HYSTERECTOMY Bilateral 02/14/2017   Procedure: HYSTERECTOMY TOTAL LAPAROSCOPIC BILATERAL SALPINGECTOMY;  Surgeon: Malachy Mood, MD;  Location: [facility] OR[facility];  Service: Gynecology;  Laterality: Bilateral;  . LEG SURGERY Bilateral    as a child to correct walking   . TONSILLECTOMY      There were no vitals filed for this visit.  Subjective Assessment - 05/26/19 1306    Subjective  Patient reports that she had a nice  weekend gaming. She notes that she has been walking a bit more at home and doing some dancing in her W/C which she enjoys. She notes that walking with the RW is feeling easier.    Currently in Pain?  Yes    Pain Score  4     Pain Location  Knee    Pain Orientation  Right;Left       TREATMENT Neuromuscular Re-education: Gait with RW, 2x80 feet, x30 feet with SBA and w/c follow. Patient requiring minimal VCs for posture and stride length. Standing balance without UE support, x2 min, SBA Standing balance with UE challenge: 5# DB: bicep curl x10, chest press x10, OH press x10 for improved postural control with dual task Standing balance with UE reaching , dual task with external cues for cone organization, 6x8 reps in standing without UE support. Last sets performed with 1.5# CW on L wrist for increased challenge  Therapeutic Exercise: NuStep, seat 8, L4-6, x10 min for improved tolerance to WB and activity, VCs for increased LLE activation Seated BLE LAQ 4# CW , x20   Patient educated throughout session on appropriate technique and form using multi-modal cueing, HEP, and activity modification. Patient articulated understanding and returned demonstration.  Patient Response to interventions: Patient notes workout and mildly increased pain.  ASSESSMENT  Patient presents to clinic with excellent motivation to participate in therapy. Patient demonstrates deficits in pain, posture, gait, strength, balance, and LLE ROM. Patient continuing to ambulate with more symmetrical strides and decreased assistance (SBA) during today's session and responded positively to active interventions. Patient will benefit from continued skilled therapeutic intervention to address remaining deficits in pain, posture, gait, strength, balance, and LLE ROM in order to increase function and improve overall QOL.  PT Long Term Goals - 05/07/19 1506      PT LONG TERM GOAL #1   Title  Patient will be independent with HEP in  order to decrease ankle pain and increase strength in order to improve pain-free function at home and work.    Baseline  4/8: modified IND    Time  8    Period  Weeks    Status  New    Target Date  06/18/19      PT LONG TERM GOAL #2   Title  Patient will demonstrate improved function as evidenced by a score of 64 on FOTO measure for full participation in activities at home and in the community.    Baseline  IE: 51; 4/8: 51    Time  8    Period  Weeks    Status  On-going    Target Date  06/18/19      PT LONG TERM GOAL #3   Title  Patient will decrease worst pain as reported on NPRS by at least 3 points in order to demonstrate clinically significant reduction in ankle/foot pain.    Baseline  IE: 10; 4/8: 10    Time  8    Period  Weeks    Status  On-going    Target Date  06/18/19      PT LONG TERM GOAL #4   Title  Patient will improve BERG by at least 3 points in order to demonstrate clinically significant improvement in balance.    Baseline  IE: 25/56; 4/8: 25/56    Time  8    Period  Weeks    Status  On-going    Target Date  06/18/19      PT LONG TERM GOAL #5   Title  Patient will decrease 5TSTS by at least 3 seconds without UE support nor compensatory patterns in order to demonstrate clinically significant improvement in LE strength.    Baseline  IE: 29.8 s BUE support; 4/8: 29.8 s BUE support    Time  8    Period  Weeks    Status  On-going    Target Date  06/18/19            Plan - 05/26/19 1256    Clinical Impression Statement  Patient presents to clinic with excellent motivation to participate in therapy. Patient demonstrates deficits in pain, posture, gait, strength, balance, and LLE ROM. Patient continuing to ambulate with more symmetrical strides and decreased assistance (SBA) during today's session and responded positively to active interventions. Patient will benefit from continued skilled therapeutic intervention to address remaining deficits in pain, posture,  gait, strength, balance, and LLE ROM in order to increase function and improve overall QOL.    Personal Factors and Comorbidities  Age;Education;Sex;Comorbidity 3+;Fitness;Past/Current Experience;Social Background;Finances;Transportation    Comorbidities  ADHD, HTN, hyperlipidemia, migraines, adjustment disorder    Examination-Activity Limitations  Bathing;Dressing;Transfers;Bed Mobility;Lift;Squat;Stairs;Locomotion Level;Stand    Examination-Participation Restrictions  Dentist;Yard Work;Laundry;Cleaning;Community Activity;Shop;Meal Prep    Stability/Clinical Decision Making  Evolving/Moderate complexity    Rehab Potential  Good    PT Frequency  2x / week    PT Duration  8 weeks    PT Treatment/Interventions  ADLs/Self Care Home Management;Cryotherapy;Electrical Stimulation;Moist Heat;Gait training;Rose training;Functional mobility training;Therapeutic activities;Neuromuscular re-education;Balance training;Therapeutic exercise;Patient/family education;Orthotic Fit/Training;Scar mobilization;Manual techniques;Dry needling;Splinting;Taping;Joint Manipulations;Spinal Manipulations;Passive range of motion    PT Next Visit Plan  LLE WB exposure; static/dynamic balance; gait training    PT Home Exercise Plan  continue with prescribed HEP from [place] and Agree with Plan of Care  Patient       Patient will benefit from skilled therapeutic intervention in order to improve the following deficits and impairments:  Abnormal gait, Decreased balance, Decreased endurance, Decreased mobility, Difficulty walking, Hypomobility, Improper body mechanics, Pain, Impaired flexibility, Increased fascial restricitons, Decreased strength, Decreased safety awareness, Decreased coordination, Decreased activity tolerance, Decreased range of motion, Decreased knowledge of precautions  Visit Diagnosis: Difficulty in walking, not elsewhere classified  Muscle weakness (generalized)  Pain in left lower  leg     Problem List Patient Active Problem List   Diagnosis Date Noted  . S/P laparoscopic hysterectomy 02/14/2017   Myles Gip PT, DPT [phone] 05/26/2019, 2:18 PM  [facility] [address] [place], [place], [address] Phone: [phone]   Fax:  [phone]  Name: TIKVAH ANCIRA MRN: [id number] Date of Birth: 07-16-1975

## 2019-05-28 ENCOUNTER — Encounter: Payer: Self-pay | Admitting: Physical Therapy

## 2019-05-28 ENCOUNTER — Other Ambulatory Visit: Payer: Self-pay

## 2019-05-28 ENCOUNTER — Ambulatory Visit: Payer: Medicaid Other | Admitting: Physical Therapy

## 2019-05-28 DIAGNOSIS — R262 Difficulty in walking, not elsewhere classified: Secondary | ICD-10-CM | POA: Diagnosis not present

## 2019-05-28 DIAGNOSIS — M79662 Pain in left lower leg: Secondary | ICD-10-CM

## 2019-05-28 DIAGNOSIS — M6281 Muscle weakness (generalized): Secondary | ICD-10-CM

## 2019-05-28 NOTE — Therapy (Signed)
[place] [facility] [address] [place], [place], [address] Phone: [phone]   Fax:  [phone]  Physical Therapy Treatment  Patient Details  Name: Ashley Rose MRN: [id number] Date of Birth: 08-13-1975 Referring Provider (PT): Durenda Hurt   Encounter Date: 05/28/2019  PT End of Session - 05/28/19 1414    Visit Number  8    Number of Visits  17    Date for PT Re-Evaluation  06/18/19    Authorization Type  CAID    PT Start Time  1400    PT Stop Time  1455    PT Time Calculation (min)  55 min    Equipment Utilized During Treatment  Gait belt    Activity Tolerance  Patient tolerated treatment well    Behavior During Therapy  [facility] for tasks assessed/performed       Past Medical History:  Diagnosis Date  . ADHD (attention deficit hyperactivity disorder)   . DVT (deep venous thrombosis) (HCC)    Right leg  . Family history of adverse reaction to anesthesia    grandmother had a hard time waking up  . GERD (gastroesophageal reflux disease)   . Headache    migraines  . History of kidney stones   . Hypercholesteremia   . Hypertension   . Seizures ([place])    last seizure at age 78    Past Surgical History:  Procedure Laterality Date  . CHOLECYSTECTOMY  2004  . COLPOSCOPY  01/2014  . COLPOSCOPY  03/2014  . CYSTOSCOPY N/A 02/14/2017   Procedure: CYSTOSCOPY;  Surgeon: Malachy Mood, MD;  Location: [facility] ORS;  Service: Gynecology;  Laterality: N/A;  . DILATION AND CURETTAGE OF UTERUS  2015  . LAPAROSCOPIC HYSTERECTOMY Bilateral 02/14/2017   Procedure: HYSTERECTOMY TOTAL LAPAROSCOPIC BILATERAL SALPINGECTOMY;  Surgeon: Malachy Mood, MD;  Location: [facility] ORS;  Service: Gynecology;  Laterality: Bilateral;  . LEG SURGERY Bilateral    as a child to correct walking   . TONSILLECTOMY      There were no vitals filed for this visit.  Subjective Assessment - 05/28/19 1412    Subjective  Patient notes that she was not  particularly sore after her session on Tuesday. She was able to incorporate more STS throughout her day.    Currently in Pain?  Yes    Pain Score  5     Pain Location  Knee    Pain Orientation  Right;Left       TREATMENT Neuromuscular Re-education: Gait with RW, 2x80 feet, x30 feet with SBA and w/c follow. Patient requiring minimal VCs for posture and stride length. // bars: Forward and retro walking with BUE support, SBA/CGA. Patient requiring minimal VCs for posture and stride length. Multiple laps. Lateral walking (R lead) with BUE support, CGA/MIN A. Patient requiring TCs and hip flexion block to ensure upright posture and WB through LLE.   Therapeutic Exercise: NuStep, seat 8, L4-6, x10 min for improved tolerance to WB and activity, VCs for increased LLE activation Seated BLE LAQ 4# CW , x20   Patient educated throughout session on appropriate technique and form using multi-modal cueing, HEP, and activity modification. Patient articulated understanding and returned demonstration.  Patient Response to interventions: Patient notes tibial pain > knee pain during this session.  ASSESSMENT Patient presents to clinic with excellent motivation to participate in therapy. Patient demonstrates deficits in pain, posture, gait, strength, balance, and LLE ROM. Patient able to ambulate for increased time/repetitions despite needing some  increased seated rest during today's session and responded positively to active interventions. Patient will benefit from continued skilled therapeutic intervention to address remaining deficits in pain, posture, gait, strength, balance, and LLE ROM in order to increase function and improve overall QOL.   PT Long Term Goals - 05/07/19 1506      PT LONG TERM GOAL #1   Title  Patient will be independent with HEP in order to decrease ankle pain and increase strength in order to improve pain-free function at home and work.    Baseline  4/8: modified IND    Time  8     Period  Weeks    Status  New    Target Date  06/18/19      PT LONG TERM GOAL #2   Title  Patient will demonstrate improved function as evidenced by a score of 64 on FOTO measure for full participation in activities at home and in the community.    Baseline  IE: 51; 4/8: 51    Time  8    Period  Weeks    Status  On-going    Target Date  06/18/19      PT LONG TERM GOAL #3   Title  Patient will decrease worst pain as reported on NPRS by at least 3 points in order to demonstrate clinically significant reduction in ankle/foot pain.    Baseline  IE: 10; 4/8: 10    Time  8    Period  Weeks    Status  On-going    Target Date  06/18/19      PT LONG TERM GOAL #4   Title  Patient will improve BERG by at least 3 points in order to demonstrate clinically significant improvement in balance.    Baseline  IE: 25/56; 4/8: 25/56    Time  8    Period  Weeks    Status  On-going    Target Date  06/18/19      PT LONG TERM GOAL #5   Title  Patient will decrease 5TSTS by at least 3 seconds without UE support nor compensatory patterns in order to demonstrate clinically significant improvement in LE strength.    Baseline  IE: 29.8 s BUE support; 4/8: 29.8 s BUE support    Time  8    Period  Weeks    Status  On-going    Target Date  06/18/19            Plan - 05/28/19 1414    Clinical Impression Statement  Patient presents to clinic with excellent motivation to participate in therapy. Patient demonstrates deficits in pain, posture, gait, strength, balance, and LLE ROM. Patient able to ambulate for increased time/repetitions despite needing some increased seated rest during today's session and responded positively to active interventions. Patient will benefit from continued skilled therapeutic intervention to address remaining deficits in pain, posture, gait, strength, balance, and LLE ROM in order to increase function and improve overall QOL.    Personal Factors and Comorbidities   Age;Education;Sex;Comorbidity 3+;Fitness;Past/Current Experience;Social Background;Finances;Transportation    Comorbidities  ADHD, HTN, hyperlipidemia, migraines, adjustment disorder    Examination-Activity Limitations  Bathing;Dressing;Transfers;Bed Mobility;Lift;Squat;Stairs;Locomotion Level;Stand    Examination-Participation Restrictions  Dentist;Yard Work;Laundry;Cleaning;Community Activity;Shop;Meal Prep    Stability/Clinical Decision Making  Evolving/Moderate complexity    Rehab Potential  Good    PT Frequency  2x / week    PT Duration  8 weeks    PT Treatment/Interventions  ADLs/Self Care Home Management;Cryotherapy;Electrical Stimulation;Moist  Heat;Gait training;Stair training;Functional mobility training;Therapeutic activities;Neuromuscular re-education;Balance training;Therapeutic exercise;Patient/family education;Orthotic Fit/Training;Scar mobilization;Manual techniques;Dry needling;Splinting;Taping;Joint Manipulations;Spinal Manipulations;Passive range of motion    PT Next Visit Plan  LLE WB exposure; static/dynamic balance; gait training    PT Home Exercise Plan  continue with prescribed HEP from [place] and Agree with Plan of Care  Patient       Patient will benefit from skilled therapeutic intervention in order to improve the following deficits and impairments:  Abnormal gait, Decreased balance, Decreased endurance, Decreased mobility, Difficulty walking, Hypomobility, Improper body mechanics, Pain, Impaired flexibility, Increased fascial restricitons, Decreased strength, Decreased safety awareness, Decreased coordination, Decreased activity tolerance, Decreased range of motion, Decreased knowledge of precautions  Visit Diagnosis: Difficulty in walking, not elsewhere classified  Muscle weakness (generalized)  Pain in left lower leg     Problem List Patient Active Problem List   Diagnosis Date Noted  . S/P laparoscopic hysterectomy 02/14/2017   Myles Gip PT, DPT [phone] 05/28/2019, 3:48 PM  [place] [facility] [address] [place], [place], [address] Phone: [phone]   Fax:  [phone]  Name: Ashley Rose MRN: [id number] Date of Birth: Jan 05, 1976

## 2019-06-02 ENCOUNTER — Encounter: Payer: Self-pay | Admitting: Physical Therapy

## 2019-06-02 ENCOUNTER — Other Ambulatory Visit: Payer: Self-pay

## 2019-06-02 ENCOUNTER — Ambulatory Visit: Payer: Medicaid Other | Attending: Orthopedic Surgery | Admitting: Physical Therapy

## 2019-06-02 DIAGNOSIS — M6281 Muscle weakness (generalized): Secondary | ICD-10-CM | POA: Diagnosis present

## 2019-06-02 DIAGNOSIS — M79662 Pain in left lower leg: Secondary | ICD-10-CM | POA: Diagnosis present

## 2019-06-02 DIAGNOSIS — R262 Difficulty in walking, not elsewhere classified: Secondary | ICD-10-CM | POA: Diagnosis present

## 2019-06-02 NOTE — Therapy (Signed)
[place] [facility] [address] [place], [place], [address] Phone: [phone]   Fax:  [phone]  Physical Therapy Treatment  Patient Details  Name: Ashley Rose MRN: [id number] Date of Birth: 09/19/1975 Referring Provider (PT): Durenda Hurt   Encounter Date: 06/02/2019  PT End of Session - 06/02/19 1305    Visit Number  9    Number of Visits  17    Date for PT Re-Evaluation  06/18/19    Authorization Type  CAID    PT Start Time  1254    PT Stop Time  1350    PT Time Calculation (min)  56 min    Equipment Utilized During Treatment  Gait belt    Activity Tolerance  Patient tolerated treatment well    Behavior During Therapy  [facility] for tasks assessed/performed       Past Medical History:  Diagnosis Date  . ADHD (attention deficit hyperactivity disorder)   . DVT (deep venous thrombosis) (HCC)    Right leg  . Family history of adverse reaction to anesthesia    grandmother had a hard time waking up  . GERD (gastroesophageal reflux disease)   . Headache    migraines  . History of kidney stones   . Hypercholesteremia   . Hypertension   . Seizures ([place])    last seizure at age 23    Past Surgical History:  Procedure Laterality Date  . CHOLECYSTECTOMY  2004  . COLPOSCOPY  01/2014  . COLPOSCOPY  03/2014  . CYSTOSCOPY N/A 02/14/2017   Procedure: CYSTOSCOPY;  Surgeon: Malachy Mood, MD;  Location: [facility];  Service: Gynecology;  Laterality: N/A;  . DILATION AND CURETTAGE OF UTERUS  2015  . LAPAROSCOPIC HYSTERECTOMY Bilateral 02/14/2017   Procedure: HYSTERECTOMY TOTAL LAPAROSCOPIC BILATERAL SALPINGECTOMY;  Surgeon: Malachy Mood, MD;  Location: [facility] OR[facility];  Service: Gynecology;  Laterality: Bilateral;  . LEG SURGERY Bilateral    as a child to correct walking   . TONSILLECTOMY      There were no vitals filed for this visit.  Subjective Assessment - 06/02/19 1302    Subjective  Patient notes that she tried to come  without the W/C today and made it down the hall in her apartment complex before she felt like she needed the W/C. Patient notes she is walking more in her apartment and has been leaving the w/c in the living room and using the walker to get to the bedroom. Patient reports that she would like to return to her orthopedist walking with a RW/SPC on July 6.    Currently in Pain?  Yes    Pain Score  6     Pain Location  Knee    Pain Orientation  Right;Left       TREATMENT Neuromuscular Re-education: Gait with RW, 2x80 feet, x30 feet with SBA. Patient requiring minimal VCs for posture and stride length. // bars, SBA/CGA, BUE support:  LLE 6" step taps, RLE 6" step taps, 2x20  Retro/Fwd ambulation, VCs for stride length and posture, multiple laps to fatigue   Therapeutic Exercise: NuStep, seat 8, L5-6, x10 min for improved tolerance to WB and activity, VCs for increased LLE activation Seated BLE LAQ 5# CW , 3x10   Patient educated throughout session on appropriate technique and form using multi-modal cueing, HEP, and activity modification. Patient articulated understanding and returned demonstration.  Patient Response to interventions: Patient notes workout and states that the rod is talking to her.  ASSESSMENT Patient presents to clinic with excellent motivation to participate in therapy. Patient demonstrates deficits in pain, posture, gait, strength, balance, and LLE ROM. Patient performing exercises with excellent form and consistency during today's session and responded positively to active interventions. Patient will benefit from continued skilled therapeutic intervention to address remaining deficits in pain, posture, gait, strength, balance, and LLE ROM in order to increase function and improve overall QOL.     PT Long Term Goals - 05/07/19 1506      PT LONG TERM GOAL #1   Title  Patient will be independent with HEP in order to decrease ankle pain and increase strength in order to  improve pain-free function at home and work.    Baseline  4/8: modified IND    Time  8    Period  Weeks    Status  New    Target Date  06/18/19      PT LONG TERM GOAL #2   Title  Patient will demonstrate improved function as evidenced by a score of 64 on FOTO measure for full participation in activities at home and in the community.    Baseline  IE: 51; 4/8: 51    Time  8    Period  Weeks    Status  On-going    Target Date  06/18/19      PT LONG TERM GOAL #3   Title  Patient will decrease worst pain as reported on NPRS by at least 3 points in order to demonstrate clinically significant reduction in ankle/foot pain.    Baseline  IE: 10; 4/8: 10    Time  8    Period  Weeks    Status  On-going    Target Date  06/18/19      PT LONG TERM GOAL #4   Title  Patient will improve BERG by at least 3 points in order to demonstrate clinically significant improvement in balance.    Baseline  IE: 25/56; 4/8: 25/56    Time  8    Period  Weeks    Status  On-going    Target Date  06/18/19      PT LONG TERM GOAL #5   Title  Patient will decrease 5TSTS by at least 3 seconds without UE support nor compensatory patterns in order to demonstrate clinically significant improvement in LE strength.    Baseline  IE: 29.8 s BUE support; 4/8: 29.8 s BUE support    Time  8    Period  Weeks    Status  On-going    Target Date  06/18/19            Plan - 06/02/19 1306    Clinical Impression Statement  Patient presents to clinic with excellent motivation to participate in therapy. Patient demonstrates deficits in pain, posture, gait, strength, balance, and LLE ROM. Patient performing exercises with excellent form and consistency during today's session and responded positively to active interventions. Patient will benefit from continued skilled therapeutic intervention to address remaining deficits in pain, posture, gait, strength, balance, and LLE ROM in order to increase function and improve overall  QOL.    Personal Factors and Comorbidities  Age;Education;Sex;Comorbidity 3+;Fitness;Past/Current Experience;Social Background;Finances;Transportation    Comorbidities  ADHD, HTN, hyperlipidemia, migraines, adjustment disorder    Examination-Activity Limitations  Bathing;Dressing;Transfers;Bed Mobility;Lift;Squat;Stairs;Locomotion Level;Stand    Examination-Participation Restrictions  Dentist;Yard Work;Laundry;Cleaning;Community Activity;Shop;Meal Prep    Stability/Clinical Decision Making  Evolving/Moderate complexity    Rehab Potential  Good  PT Frequency  2x / week    PT Duration  8 weeks    PT Treatment/Interventions  ADLs/Self Care Home Management;Cryotherapy;Electrical Stimulation;Moist Heat;Gait training;Stair training;Functional mobility training;Therapeutic activities;Neuromuscular re-education;Balance training;Therapeutic exercise;Patient/family education;Orthotic Fit/Training;Scar mobilization;Manual techniques;Dry needling;Splinting;Taping;Joint Manipulations;Spinal Manipulations;Passive range of motion    PT Next Visit Plan  LLE WB exposure; static/dynamic balance; gait training    PT Home Exercise Plan  continue with prescribed HEP from [place] and Agree with Plan of Care  Patient       Patient will benefit from skilled therapeutic intervention in order to improve the following deficits and impairments:  Abnormal gait, Decreased balance, Decreased endurance, Decreased mobility, Difficulty walking, Hypomobility, Improper body mechanics, Pain, Impaired flexibility, Increased fascial restricitons, Decreased strength, Decreased safety awareness, Decreased coordination, Decreased activity tolerance, Decreased range of motion, Decreased knowledge of precautions  Visit Diagnosis: Difficulty in walking, not elsewhere classified  Muscle weakness (generalized)  Pain in left lower leg     Problem List Patient Active Problem List   Diagnosis Date Noted  . S/P  laparoscopic hysterectomy 02/14/2017   Myles Gip PT, DPT [phone] 06/02/2019, 1:54 PM  [place] [facility] [address] [place], [place], [address] Phone: [phone]   Fax:  [phone]  Name: Ashley Rose MRN: [id number] Date of Birth: 1975-08-03

## 2019-06-04 ENCOUNTER — Other Ambulatory Visit: Payer: Self-pay

## 2019-06-04 ENCOUNTER — Encounter: Payer: Self-pay | Admitting: Physical Therapy

## 2019-06-04 ENCOUNTER — Ambulatory Visit: Payer: Medicaid Other | Admitting: Physical Therapy

## 2019-06-04 DIAGNOSIS — M79662 Pain in left lower leg: Secondary | ICD-10-CM

## 2019-06-04 DIAGNOSIS — M6281 Muscle weakness (generalized): Secondary | ICD-10-CM

## 2019-06-04 DIAGNOSIS — R262 Difficulty in walking, not elsewhere classified: Secondary | ICD-10-CM

## 2019-06-04 NOTE — Therapy (Signed)
[place] [facility] [address] [place], [place], [address] Phone: [phone]   Fax:  [phone]  Physical Therapy Treatment  Patient Details  Name: Ashley Rose MRN: [id number] Date of Birth: [birth year] Referring Provider (PT): Durenda Hurt   Encounter Date: 06/04/2019  PT End of Session - 06/04/19 1415    Visit Number  10    Number of Visits  17    Date for PT Re-Evaluation  06/18/19    Authorization Type  CAID    PT Start Time  1400    PT Stop Time  1455    PT Time Calculation (min)  55 min    Equipment Utilized During Treatment  Gait belt    Activity Tolerance  Patient tolerated treatment well    Behavior During Therapy  [facility] for tasks assessed/performed       Past Medical History:  Diagnosis Date  . ADHD (attention deficit hyperactivity disorder)   . DVT (deep venous thrombosis) (HCC)    Right leg  . Family history of adverse reaction to anesthesia    grandmother had a hard time waking up  . GERD (gastroesophageal reflux disease)   . Headache    migraines  . History of kidney stones   . Hypercholesteremia   . Hypertension   . Seizures ([place])    last seizure at age 22    Past Surgical History:  Procedure Laterality Date  . CHOLECYSTECTOMY  2004  . COLPOSCOPY  01/2014  . COLPOSCOPY  03/2014  . CYSTOSCOPY N/A 02/14/2017   Procedure: CYSTOSCOPY;  Surgeon: Malachy Mood, MD;  Location: [facility] ORS;  Service: Gynecology;  Laterality: N/A;  . DILATION AND CURETTAGE OF UTERUS  2015  . LAPAROSCOPIC HYSTERECTOMY Bilateral 02/14/2017   Procedure: HYSTERECTOMY TOTAL LAPAROSCOPIC BILATERAL SALPINGECTOMY;  Surgeon: Malachy Mood, MD;  Location: [facility] OR[facility];  Service: Gynecology;  Laterality: Bilateral;  . LEG SURGERY Bilateral    as a child to correct walking   . TONSILLECTOMY      There were no vitals filed for this visit.  Subjective Assessment - 06/04/19 1414    Subjective  Patient notes that she rested  yesterday and hasn't done much today, but was able to safely ambulate with RW to the restroom in the lobby prior to the session. Patient reports that she is feeling some pain in her knees and attributes this to the weather. Patient denies any other changes since last session.    Currently in Pain?  Yes    Pain Score  4     Pain Location  Knee    Pain Orientation  Right;Left       TREATMENT Neuromuscular Re-education: Gait with RW, x140 feet, x30 feet with SBA. Patient requiring minimal VCs for posture and stride length. // bars, SBA/CGA, BUE support:  Gait with lofstrand crutches, minimal VCs for sequencing for initial steps, x2 laps  LLE 6" step taps,2x20; RLE 6" step taps, 4x10 (LLE buckled 2x with BUE support, patient recovery with CGA)  Retro/Fwd ambulation, VCs for stride length and posture, multiple laps to fatigue   Patient educated throughout session on appropriate technique and form using multi-modal cueing, HEP, and activity modification. Patient articulated understanding and returned demonstration.  Patient Response to interventions: Patient states "I probably won't do much tonight."  ASSESSMENT Patient presents to clinic with excellent motivation to participate in therapy. Patient demonstrates deficits in pain, posture, gait, strength, balance, and LLE ROM. Patient ambulating for increased distance and  standing > 5 minutes during today's session and responded positively to active interventions. Patient will benefit from continued skilled therapeutic intervention to address remaining deficits in pain, posture, gait, strength, balance, and LLE ROM in order to increase function and improve overall QOL.     PT Long Term Goals - 05/07/19 1506      PT LONG TERM GOAL #1   Title  Patient will be independent with HEP in order to decrease ankle pain and increase strength in order to improve pain-free function at home and work.    Baseline  4/8: modified IND    Time  8    Period   Weeks    Status  New    Target Date  06/18/19      PT LONG TERM GOAL #2   Title  Patient will demonstrate improved function as evidenced by a score of 64 on FOTO measure for full participation in activities at home and in the community.    Baseline  IE: 51; 4/8: 51    Time  8    Period  Weeks    Status  On-going    Target Date  06/18/19      PT LONG TERM GOAL #3   Title  Patient will decrease worst pain as reported on NPRS by at least 3 points in order to demonstrate clinically significant reduction in ankle/foot pain.    Baseline  IE: 10; 4/8: 10    Time  8    Period  Weeks    Status  On-going    Target Date  06/18/19      PT LONG TERM GOAL #4   Title  Patient will improve BERG by at least 3 points in order to demonstrate clinically significant improvement in balance.    Baseline  IE: 25/56; 4/8: 25/56    Time  8    Period  Weeks    Status  On-going    Target Date  06/18/19      PT LONG TERM GOAL #5   Title  Patient will decrease 5TSTS by at least 3 seconds without UE support nor compensatory patterns in order to demonstrate clinically significant improvement in LE strength.    Baseline  IE: 29.8 s BUE support; 4/8: 29.8 s BUE support    Time  8    Period  Weeks    Status  On-going    Target Date  06/18/19            Plan - 06/04/19 1415    Clinical Impression Statement  Patient presents to clinic with excellent motivation to participate in therapy. Patient demonstrates deficits in pain, posture, gait, strength, balance, and LLE ROM. Patient ambulating for increased distance and standing > 5 minutes during today's session and responded positively to active interventions. Patient will benefit from continued skilled therapeutic intervention to address remaining deficits in pain, posture, gait, strength, balance, and LLE ROM in order to increase function and improve overall QOL.    Personal Factors and Comorbidities  Age;Education;Sex;Comorbidity 3+;Fitness;Past/Current  Experience;Social Background;Finances;Transportation    Comorbidities  ADHD, HTN, hyperlipidemia, migraines, adjustment disorder    Examination-Activity Limitations  Bathing;Dressing;Transfers;Bed Mobility;Lift;Squat;Stairs;Locomotion Level;Stand    Examination-Participation Restrictions  Dentist;Yard Work;Laundry;Cleaning;Community Activity;Shop;Meal Prep    Stability/Clinical Decision Making  Evolving/Moderate complexity    Rehab Potential  Good    PT Frequency  2x / week    PT Duration  8 weeks    PT Treatment/Interventions  ADLs/Self Care Home Management;Cryotherapy;Electrical Stimulation;Moist  Heat;Gait training;Stair training;Functional mobility training;Therapeutic activities;Neuromuscular re-education;Balance training;Therapeutic exercise;Patient/family education;Orthotic Fit/Training;Scar mobilization;Manual techniques;Dry needling;Splinting;Taping;Joint Manipulations;Spinal Manipulations;Passive range of motion    PT Next Visit Plan  LLE WB exposure; static/dynamic balance; gait training    PT Home Exercise Plan  continue with prescribed HEP from [place] and Agree with Plan of Care  Patient       Patient will benefit from skilled therapeutic intervention in order to improve the following deficits and impairments:  Abnormal gait, Decreased balance, Decreased endurance, Decreased mobility, Difficulty walking, Hypomobility, Improper body mechanics, Pain, Impaired flexibility, Increased fascial restricitons, Decreased strength, Decreased safety awareness, Decreased coordination, Decreased activity tolerance, Decreased range of motion, Decreased knowledge of precautions  Visit Diagnosis: Difficulty in walking, not elsewhere classified  Muscle weakness (generalized)  Pain in left lower leg     Problem List Patient Active Problem List   Diagnosis Date Noted  . S/P laparoscopic hysterectomy 02/14/2017   Myles Gip PT, DPT [phone]  06/04/2019, 3:04 PM  [facility] [address] [place], [place], [address] Phone: [phone]   Fax:  [phone]  Name: Ashley Rose MRN: [id number] Date of Birth: April 01, 1975

## 2019-06-09 ENCOUNTER — Encounter: Payer: Self-pay | Admitting: Physical Therapy

## 2019-06-09 ENCOUNTER — Other Ambulatory Visit: Payer: Self-pay

## 2019-06-09 ENCOUNTER — Ambulatory Visit: Payer: Medicaid Other | Admitting: Physical Therapy

## 2019-06-09 DIAGNOSIS — R262 Difficulty in walking, not elsewhere classified: Secondary | ICD-10-CM

## 2019-06-09 DIAGNOSIS — M79662 Pain in left lower leg: Secondary | ICD-10-CM

## 2019-06-09 DIAGNOSIS — M6281 Muscle weakness (generalized): Secondary | ICD-10-CM

## 2019-06-09 NOTE — Therapy (Signed)
[facility] [address] [place], [place], [address] Phone: [phone]   Fax:  [phone]  Physical Therapy Treatment  Patient Details  Name: Ashley Rose MRN: [id number] Date of Birth: 04-20-1975 Referring Provider (PT): Durenda Hurt   Encounter Date: 06/09/2019  PT End of Session - 06/09/19 1309    Visit Number  11    Number of Visits  17    Date for PT Re-Evaluation  06/18/19    Authorization Type  CAID    PT Start Time  1255    PT Stop Time  1350    PT Time Calculation (min)  55 min    Equipment Utilized During Treatment  Gait belt    Activity Tolerance  Patient tolerated treatment well    Behavior During Therapy  [facility] for tasks assessed/performed       Past Medical History:  Diagnosis Date  . ADHD (attention deficit hyperactivity disorder)   . DVT (deep venous thrombosis) (HCC)    Right leg  . Family history of adverse reaction to anesthesia    grandmother had a hard time waking up  . GERD (gastroesophageal reflux disease)   . Headache    migraines  . History of kidney stones   . Hypercholesteremia   . Hypertension   . Seizures ([place])    last seizure at age 3    Past Surgical History:  Procedure Laterality Date  . CHOLECYSTECTOMY  2004  . COLPOSCOPY  01/2014  . COLPOSCOPY  03/2014  . CYSTOSCOPY N/A 02/14/2017   Procedure: CYSTOSCOPY;  Surgeon: Malachy Mood, MD;  Location: [facility] ORS;  Service: Gynecology;  Laterality: N/A;  . DILATION AND CURETTAGE OF UTERUS  2015  . LAPAROSCOPIC HYSTERECTOMY Bilateral 02/14/2017   Procedure: HYSTERECTOMY TOTAL LAPAROSCOPIC BILATERAL SALPINGECTOMY;  Surgeon: Malachy Mood, MD;  Location: [facility] ORS;  Service: Gynecology;  Laterality: Bilateral;  . LEG SURGERY Bilateral    as a child to correct walking   . TONSILLECTOMY      There were no vitals filed for this visit.  Subjective Assessment - 06/09/19 1308    Subjective  Patient reports that the L front  wheel fell off her w/c which has made getting around challenging. Patient notes that she is having increased B knee pain and has been using ointment on it with minimal relief.    Currently in Pain?  Yes    Pain Score  6     Pain Location  Knee    Pain Orientation  Right;Left       TREATMENT Neuromuscular Re-education: Gait with RW, 3x100 feet, x60 feet with SBA. Patient requiring minimal VCs for posture and stride length.   Therapeutic Exercise: NuStep, seat 7, L5-6, x10 min for improved tolerance to WB and activity, VCs for increased LLE activation Seated BLE LAQ 4# CW , 3x10  Seated BLE hip flexion march 4# CW, 3x10  Patient educated throughout session on appropriate technique and form using multi-modal cueing, HEP, and activity modification. Patient articulated understanding and returned demonstration.  Patient Response to interventions: Patient notes workout and states that the rod is talking to her.  ASSESSMENT Patient presents to clinic with excellent motivation to participate in therapy. Patient demonstrates deficits in pain, posture, gait, strength, balance, and LLE ROM. Patient able to tolerate 34 minutes of continuous activity with combination of walking and seated cardiovascular actvity during today's session and responded positively to active interventions. Patient will benefit from continued skilled therapeutic intervention  to address remaining deficits in pain, posture, gait, strength, balance, and LLE ROM in order to increase function and improve overall QOL.    PT Long Term Goals - 05/07/19 1506      PT LONG TERM GOAL #1   Title  Patient will be independent with HEP in order to decrease ankle pain and increase strength in order to improve pain-free function at home and work.    Baseline  4/8: modified IND    Time  8    Period  Weeks    Status  New    Target Date  06/18/19      PT LONG TERM GOAL #2   Title  Patient will demonstrate improved function as evidenced  by a score of 64 on FOTO measure for full participation in activities at home and in the community.    Baseline  IE: 51; 4/8: 51    Time  8    Period  Weeks    Status  On-going    Target Date  06/18/19      PT LONG TERM GOAL #3   Title  Patient will decrease worst pain as reported on NPRS by at least 3 points in order to demonstrate clinically significant reduction in ankle/foot pain.    Baseline  IE: 10; 4/8: 10    Time  8    Period  Weeks    Status  On-going    Target Date  06/18/19      PT LONG TERM GOAL #4   Title  Patient will improve BERG by at least 3 points in order to demonstrate clinically significant improvement in balance.    Baseline  IE: 25/56; 4/8: 25/56    Time  8    Period  Weeks    Status  On-going    Target Date  06/18/19      PT LONG TERM GOAL #5   Title  Patient will decrease 5TSTS by at least 3 seconds without UE support nor compensatory patterns in order to demonstrate clinically significant improvement in LE strength.    Baseline  IE: 29.8 s BUE support; 4/8: 29.8 s BUE support    Time  8    Period  Weeks    Status  On-going    Target Date  06/18/19            Plan - 06/09/19 1311    Clinical Impression Statement  Patient presents to clinic with excellent motivation to participate in therapy. Patient demonstrates deficits in pain, posture, gait, strength, balance, and LLE ROM. Patient able to tolerate 34 minutes of continuous activity with combination of walking and seated cardiovascular actvity during today's session and responded positively to active interventions. Patient will benefit from continued skilled therapeutic intervention to address remaining deficits in pain, posture, gait, strength, balance, and LLE ROM in order to increase function and improve overall QOL.    Personal Factors and Comorbidities  Age;Education;Sex;Comorbidity 3+;Fitness;Past/Current Experience;Social Background;Finances;Transportation    Comorbidities  ADHD, HTN,  hyperlipidemia, migraines, adjustment disorder    Examination-Activity Limitations  Bathing;Dressing;Transfers;Bed Mobility;Lift;Squat;Stairs;Locomotion Level;Stand    Examination-Participation Restrictions  Dentist;Yard Work;Laundry;Cleaning;Community Activity;Shop;Meal Prep    Stability/Clinical Decision Making  Evolving/Moderate complexity    Rehab Potential  Good    PT Frequency  2x / week    PT Duration  8 weeks    PT Treatment/Interventions  ADLs/Self Care Home Management;Cryotherapy;Electrical Stimulation;Moist Heat;Gait training;Stair training;Functional mobility training;Therapeutic activities;Neuromuscular re-education;Balance training;Therapeutic exercise;Patient/family education;Orthotic Fit/Training;Scar mobilization;Manual techniques;Dry needling;Splinting;Taping;Joint Manipulations;Spinal  Manipulations;Passive range of motion    PT Next Visit Plan  LLE WB exposure; static/dynamic balance; gait training    PT Home Exercise Plan  continue with prescribed HEP from [place] and Agree with Plan of Care  Patient       Patient will benefit from skilled therapeutic intervention in order to improve the following deficits and impairments:  Abnormal gait, Decreased balance, Decreased endurance, Decreased mobility, Difficulty walking, Hypomobility, Improper body mechanics, Pain, Impaired flexibility, Increased fascial restricitons, Decreased strength, Decreased safety awareness, Decreased coordination, Decreased activity tolerance, Decreased range of motion, Decreased knowledge of precautions  Visit Diagnosis: Difficulty in walking, not elsewhere classified  Muscle weakness (generalized)  Pain in left lower leg     Problem List Patient Active Problem List   Diagnosis Date Noted  . S/P laparoscopic hysterectomy 02/14/2017   Myles Gip PT, DPT [phone] 06/09/2019, 1:51 PM  [place] [facility] [address] [place],  [place], [address] Phone: [phone]   Fax:  [phone]  Name: Ashley Rose MRN: [id number] Date of Birth: 07/31/1975

## 2019-06-16 ENCOUNTER — Other Ambulatory Visit: Payer: Self-pay

## 2019-06-16 ENCOUNTER — Ambulatory Visit: Payer: Medicaid Other | Admitting: Physical Therapy

## 2019-06-16 ENCOUNTER — Encounter: Payer: Self-pay | Admitting: Physical Therapy

## 2019-06-16 DIAGNOSIS — M6281 Muscle weakness (generalized): Secondary | ICD-10-CM

## 2019-06-16 DIAGNOSIS — M79662 Pain in left lower leg: Secondary | ICD-10-CM

## 2019-06-16 DIAGNOSIS — R262 Difficulty in walking, not elsewhere classified: Secondary | ICD-10-CM

## 2019-06-16 NOTE — Therapy (Signed)
[facility] [address] [place], [place], [address] Phone: [phone]   Fax:  [phone]  Physical Therapy Treatment  Patient Details  Name: Ashley Rose MRN: [id number] Date of Birth: 03-01-1975 Referring Provider (PT): Durenda Hurt   Encounter Date: 06/16/2019  PT End of Session - 06/16/19 1312    Visit Number  12    Number of Visits  32    Date for PT Re-Evaluation  08/11/19    Authorization Type  CAID    PT Start Time  1300    PT Stop Time  1355    PT Time Calculation (min)  55 min    Equipment Utilized During Treatment  Gait belt    Activity Tolerance  Patient tolerated treatment well    Behavior During Therapy  [facility] for tasks assessed/performed       Past Medical History:  Diagnosis Date  . ADHD (attention deficit hyperactivity disorder)   . DVT (deep venous thrombosis) (HCC)    Right leg  . Family history of adverse reaction to anesthesia    grandmother had a hard time waking up  . GERD (gastroesophageal reflux disease)   . Headache    migraines  . History of kidney stones   . Hypercholesteremia   . Hypertension   . Seizures ([place])    last seizure at age 47    Past Surgical History:  Procedure Laterality Date  . CHOLECYSTECTOMY  2004  . COLPOSCOPY  01/2014  . COLPOSCOPY  03/2014  . CYSTOSCOPY N/A 02/14/2017   Procedure: CYSTOSCOPY;  Surgeon: Malachy Mood, MD;  Location: [facility] ORS;  Service: Gynecology;  Laterality: N/A;  . DILATION AND CURETTAGE OF UTERUS  2015  . LAPAROSCOPIC HYSTERECTOMY Bilateral 02/14/2017   Procedure: HYSTERECTOMY TOTAL LAPAROSCOPIC BILATERAL SALPINGECTOMY;  Surgeon: Malachy Mood, MD;  Location: [facility] ORS;  Service: Gynecology;  Laterality: Bilateral;  . LEG SURGERY Bilateral    as a child to correct walking   . TONSILLECTOMY      There were no vitals filed for this visit.  Subjective Assessment - 06/16/19 1307    Subjective  Patient reports that she was  pretty sore after standing/walking more last week 2/2 to having a broken w/c. Patient got new w/c on Wednesday evening and was better able to stand/walk with rest breaks. Patient notes she has been staying more active. Patient has been cooking in standing with occasional seated breaks. Patient notes she isn't in too much pain today.    Currently in Pain?  Yes    Pain Score  5     Pain Location  Knee    Pain Orientation  Left;Right    Pain Descriptors / Indicators  Aching       TREATMENT Neuromuscular Re-education: Reassessed goals; see below. Gait with RW 3x80 feet with min VCs for posture and stride length; no noted unsteadiness   Therapeutic Exercise: NuStep, seat 7, L5-6, x10 min for improved tolerance to WB and activity, VCs for increased LLE activation STS with faded UE support, 3x5  Patient educated throughout session on appropriate technique and form using multi-modal cueing, HEP, and activity modification. Patient articulated understanding and returned demonstration.  Patient Response to interventions: Patient notes workout and states that the rod is talking to her.  ASSESSMENT Patient presents to clinic with excellent motivation to participate in therapy. Patient demonstrates deficits in pain, posture, gait, strength, balance, and LLE ROM. Patient completed Berg Balance assessment during today's session and  demonstrated 13 point improvement as well as decreased reliance on UE for STS from standard height chair. Patient's condition has the potential to improve in response to therapy. Maximum improvement is yet to be obtained. The anticipated improvement is attainable and reasonable in a generally predictable time. Patient will benefit from continued skilled therapeutic intervention to address remaining deficits in pain, posture, gait, strength, balance, and LLE ROM in order to increase function and improve overall QOL.   PT Long Term Goals - 06/16/19 1313      PT LONG TERM GOAL  #1   Title  Patient will be independent with HEP in order to decrease ankle pain and increase strength in order to improve pain-free function at home and work.    Baseline  4/8: modified IND; 5/18: IND    Time  8    Period  Weeks    Status  New    Target Date  08/11/19      PT LONG TERM GOAL #2   Title  Patient will demonstrate improved function as evidenced by a score of 64 on FOTO measure for full participation in activities at home and in the community.    Baseline  IE: 51; 4/8: 51; 5/18: 40    Time  8    Period  Weeks    Status  On-going    Target Date  08/11/19      PT LONG TERM GOAL #3   Title  Patient will decrease worst pain as reported on NPRS by at least 3 points in order to demonstrate clinically significant reduction in ankle/foot pain.    Baseline  IE: 10; 4/8: 10; 5/18: 7/10    Time  8    Period  Weeks    Status  On-going    Target Date  08/11/19      PT LONG TERM GOAL #4   Title  Patient will improve BERG by at least 3 points in order to demonstrate clinically significant improvement in balance.    Baseline  IE: 25/56; 4/8: 25/56; 5/18:    Time  8    Period  Weeks    Status  On-going    Target Date  08/11/19      PT LONG TERM GOAL #5   Title  Patient will decrease 5TSTS by at least 3 seconds without UE support nor compensatory patterns in order to demonstrate clinically significant improvement in LE strength.    Baseline  IE: 29.8 s BUE support; 4/8: 29.8 s BUE support; 5/18: 25.5 s BUE on thighs    Time  8    Period  Weeks    Status  On-going    Target Date  08/11/19            Plan - 06/16/19 1312    Clinical Impression Statement  Patient presents to clinic with excellent motivation to participate in therapy. Patient demonstrates deficits in pain, posture, gait, strength, balance, and LLE ROM. Patient completed Berg Balance assessment during today's session and demonstrated 13 point improvement as well as decreased reliance on UE for STS from standard  height chair. Patient's condition has the potential to improve in response to therapy. Maximum improvement is yet to be obtained. The anticipated improvement is attainable and reasonable in a generally predictable time. Patient will benefit from continued skilled therapeutic intervention to address remaining deficits in pain, posture, gait, strength, balance, and LLE ROM in order to increase function and improve overall QOL.    Personal Factors  and Comorbidities  Age;Education;Sex;Comorbidity 3+;Fitness;Past/Current Experience;Social Background;Finances;Transportation    Comorbidities  ADHD, HTN, hyperlipidemia, migraines, adjustment disorder    Examination-Activity Limitations  Bathing;Dressing;Transfers;Bed Mobility;Lift;Squat;Stairs;Locomotion Level;Stand    Examination-Participation Restrictions  Dentist;Yard Work;Laundry;Cleaning;Community Activity;Shop;Meal Prep    Stability/Clinical Decision Making  Evolving/Moderate complexity    Rehab Potential  Good    PT Frequency  2x / week    PT Duration  8 weeks    PT Treatment/Interventions  ADLs/Self Care Home Management;Cryotherapy;Electrical Stimulation;Moist Heat;Gait training;Stair training;Functional mobility training;Therapeutic activities;Neuromuscular re-education;Balance training;Therapeutic exercise;Patient/family education;Orthotic Fit/Training;Scar mobilization;Manual techniques;Dry needling;Splinting;Taping;Joint Manipulations;Spinal Manipulations;Passive range of motion    PT Next Visit Plan  LLE WB exposure; static/dynamic balance; gait training    PT Home Exercise Plan  continue with prescribed HEP from [place] and Agree with Plan of Care  Patient       Patient will benefit from skilled therapeutic intervention in order to improve the following deficits and impairments:  Abnormal gait, Decreased balance, Decreased endurance, Decreased mobility, Difficulty walking, Hypomobility, Improper body mechanics, Pain,  Impaired flexibility, Increased fascial restricitons, Decreased strength, Decreased safety awareness, Decreased coordination, Decreased activity tolerance, Decreased range of motion, Decreased knowledge of precautions  Visit Diagnosis: Difficulty in walking, not elsewhere classified - Plan: PT plan of care cert/re-cert  Muscle weakness (generalized) - Plan: PT plan of care cert/re-cert  Pain in left lower leg - Plan: PT plan of care cert/re-cert     Problem List Patient Active Problem List   Diagnosis Date Noted  . S/P laparoscopic hysterectomy 02/14/2017   Myles Gip PT, DPT [phone] 06/16/2019, 3:00 PM  [place] [facility] [address] [place], [place], [address] Phone: [phone]   Fax:  [phone]  Name: Ashley Rose MRN: [id number] Date of Birth: February 18, 1975

## 2019-06-18 ENCOUNTER — Encounter: Payer: Medicaid Other | Admitting: Physical Therapy

## 2019-06-23 ENCOUNTER — Other Ambulatory Visit: Payer: Self-pay

## 2019-06-23 ENCOUNTER — Encounter: Payer: Self-pay | Admitting: Physical Therapy

## 2019-06-23 ENCOUNTER — Ambulatory Visit: Payer: Medicaid Other | Admitting: Physical Therapy

## 2019-06-23 DIAGNOSIS — R262 Difficulty in walking, not elsewhere classified: Secondary | ICD-10-CM | POA: Diagnosis not present

## 2019-06-23 DIAGNOSIS — M6281 Muscle weakness (generalized): Secondary | ICD-10-CM

## 2019-06-23 DIAGNOSIS — M79662 Pain in left lower leg: Secondary | ICD-10-CM

## 2019-06-23 NOTE — Therapy (Signed)
[facility] [address] [place], [place], [address] Phone: [phone]   Fax:  [phone]  Physical Therapy Treatment  Patient Details  Name: Ashley Rose MRN: [id number] Date of Birth: December 15, 1975 Referring Provider (PT): Durenda Hurt   Encounter Date: 06/23/2019  PT End of Session - 06/23/19 1312    Visit Number  13    Number of Visits  32    Date for PT Re-Evaluation  08/11/19    Authorization Type  CAID    Authorization - Visit Number  1    Authorization - Number of Visits  9    PT Start Time  [id number]    PT Stop Time  [id number]    PT Time Calculation (min)  56 min    Equipment Utilized During Treatment  Gait belt    Activity Tolerance  Patient tolerated treatment well    Behavior During Therapy  [facility] for tasks assessed/performed       Past Medical History:  Diagnosis Date  . ADHD (attention deficit hyperactivity disorder)   . DVT (deep venous thrombosis) (HCC)    Right leg  . Family history of adverse reaction to anesthesia    grandmother had a hard time waking up  . GERD (gastroesophageal reflux disease)   . Headache    migraines  . History of kidney stones   . Hypercholesteremia   . Hypertension   . Seizures ([place])    last seizure at age 44    Past Surgical History:  Procedure Laterality Date  . CHOLECYSTECTOMY  2004  . COLPOSCOPY  01/2014  . COLPOSCOPY  03/2014  . CYSTOSCOPY N/A 02/14/2017   Procedure: CYSTOSCOPY;  Surgeon: Malachy Mood, MD;  Location: [facility];  Service: Gynecology;  Laterality: N/A;  . DILATION AND CURETTAGE OF UTERUS  2015  . LAPAROSCOPIC HYSTERECTOMY Bilateral 02/14/2017   Procedure: HYSTERECTOMY TOTAL LAPAROSCOPIC BILATERAL SALPINGECTOMY;  Surgeon: Malachy Mood, MD;  Location: [facility];  Service: Gynecology;  Laterality: Bilateral;  . LEG SURGERY Bilateral    as a child to correct walking   . TONSILLECTOMY      There were no vitals filed for this visit.  Subjective  Assessment - 06/23/19 1309    Subjective  Patient states that she had an uneventful weekend and has not been able to walk in the hallway at her apartment because her fiance has been sleeping a lot. Patient does note some increased standing and cooking at home.    Currently in Pain?  Yes    Pain Score  5     Pain Location  Knee    Pain Orientation  Left;Right        TREATMENT Neuromuscular Re-education: Gait with RW, 2x 240 feet, 2x100 feet with SBA. Patient requiring minimal VCs for posture and stride length.  Therapeutic Exercise: NuStep, seat 7, L5-6, x10 min for improved tolerance to WB and activity, VCs for increased LLE activation Seated BLE LAQ 5# CW , 2x15  Seated BLE hip flexion march 5# CW, 2x15  Patient educated throughout session on appropriate technique and form using multi-modal cueing, HEP, and activity modification. Patient articulated understanding and returned demonstration.  Patient Response to interventions: Patient noting some fatigue at end of session.  ASSESSMENT Patient presents to clinic with excellent motivation to participate in therapy. Patient demonstrates deficits in pain, posture, gait, strength, balance, and LLE ROM. Patient able to ambulate for increased distance and duration prior to rest break during today's session  and responded positively to active interventions. Patient will benefit from continued skilled therapeutic intervention to address remaining deficits in pain, posture, gait, strength, balance, and LLE ROM in order to increase function and improve overall QOL.   PT Long Term Goals - 06/16/19 1313      PT LONG TERM GOAL #1   Title  Patient will be independent with HEP in order to decrease ankle pain and increase strength in order to improve pain-free function at home and work.    Baseline  4/8: modified IND; 5/18: IND    Time  8    Period  Weeks    Status  New    Target Date  08/11/19      PT LONG TERM GOAL #2   Title  Patient will  demonstrate improved function as evidenced by a score of 64 on FOTO measure for full participation in activities at home and in the community.    Baseline  IE: 51; 4/8: 51; 5/18: 40    Time  8    Period  Weeks    Status  On-going    Target Date  08/11/19      PT LONG TERM GOAL #3   Title  Patient will decrease worst pain as reported on NPRS by at least 3 points in order to demonstrate clinically significant reduction in ankle/foot pain.    Baseline  IE: 10; 4/8: 10; 5/18: 7/10    Time  8    Period  Weeks    Status  On-going    Target Date  08/11/19      PT LONG TERM GOAL #4   Title  Patient will improve BERG by at least 3 points in order to demonstrate clinically significant improvement in balance.    Baseline  IE: 25/56; 4/8: 25/56; 5/18:    Time  8    Period  Weeks    Status  On-going    Target Date  08/11/19      PT LONG TERM GOAL #5   Title  Patient will decrease 5TSTS by at least 3 seconds without UE support nor compensatory patterns in order to demonstrate clinically significant improvement in LE strength.    Baseline  IE: 29.8 s BUE support; 4/8: 29.8 s BUE support; 5/18: 25.5 s BUE on thighs    Time  8    Period  Weeks    Status  On-going    Target Date  08/11/19            Plan - 06/23/19 1312    Clinical Impression Statement  Patient presents to clinic with excellent motivation to participate in therapy. Patient demonstrates deficits in pain, posture, gait, strength, balance, and LLE ROM. Patient able to ambulate for increased distance and duration prior to rest break during today's session and responded positively to active interventions. Patient will benefit from continued skilled therapeutic intervention to address remaining deficits in pain, posture, gait, strength, balance, and LLE ROM in order to increase function and improve overall QOL.    Personal Factors and Comorbidities  Age;Education;Sex;Comorbidity 3+;Fitness;Past/Current Experience;Social  Background;Finances;Transportation    Comorbidities  ADHD, HTN, hyperlipidemia, migraines, adjustment disorder    Examination-Activity Limitations  Bathing;Dressing;Transfers;Bed Mobility;Lift;Squat;Stairs;Locomotion Level;Stand    Examination-Participation Restrictions  Dentist;Yard Work;Laundry;Cleaning;Community Activity;Shop;Meal Prep    Stability/Clinical Decision Making  Evolving/Moderate complexity    Rehab Potential  Good    PT Frequency  2x / week    PT Duration  8 weeks    PT  Treatment/Interventions  ADLs/Self Care Home Management;Cryotherapy;Electrical Stimulation;Moist Heat;Gait training;Stair training;Functional mobility training;Therapeutic activities;Neuromuscular re-education;Balance training;Therapeutic exercise;Patient/family education;Orthotic Fit/Training;Scar mobilization;Manual techniques;Dry needling;Splinting;Taping;Joint Manipulations;Spinal Manipulations;Passive range of motion    PT Next Visit Plan  LLE WB exposure; static/dynamic balance; gait training    PT Home Exercise Plan  continue with prescribed HEP from [place] and Agree with Plan of Care  Patient       Patient will benefit from skilled therapeutic intervention in order to improve the following deficits and impairments:  Abnormal gait, Decreased balance, Decreased endurance, Decreased mobility, Difficulty walking, Hypomobility, Improper body mechanics, Pain, Impaired flexibility, Increased fascial restricitons, Decreased strength, Decreased safety awareness, Decreased coordination, Decreased activity tolerance, Decreased range of motion, Decreased knowledge of precautions  Visit Diagnosis: Difficulty in walking, not elsewhere classified  Muscle weakness (generalized)  Pain in left lower leg     Problem List Patient Active Problem List   Diagnosis Date Noted  . S/P laparoscopic hysterectomy 02/14/2017   Myles Gip PT, DPT [phone] 06/23/2019, 3:29 PM  [facility] [address] [place], [place], [address] Phone: [phone]   Fax:  [phone]  Name: TONE MILLAGE MRN: [id number] Date of Birth: April 18, 1975

## 2019-06-25 ENCOUNTER — Other Ambulatory Visit: Payer: Self-pay

## 2019-06-25 ENCOUNTER — Encounter: Payer: Self-pay | Admitting: Physical Therapy

## 2019-06-25 ENCOUNTER — Ambulatory Visit: Payer: Medicaid Other | Admitting: Physical Therapy

## 2019-06-25 DIAGNOSIS — M6281 Muscle weakness (generalized): Secondary | ICD-10-CM

## 2019-06-25 DIAGNOSIS — R262 Difficulty in walking, not elsewhere classified: Secondary | ICD-10-CM

## 2019-06-25 DIAGNOSIS — M79662 Pain in left lower leg: Secondary | ICD-10-CM

## 2019-06-25 NOTE — Therapy (Signed)
[facility] [address] [place], [place], [address] Phone: [phone]   Fax:  [phone]  Physical Therapy Treatment  Patient Details  Name: Ashley Rose MRN: [id number] Date of Birth: 1975-05-21 Referring Provider (PT): Durenda Hurt   Encounter Date: 06/25/2019  PT End of Session - 06/25/19 1643    Visit Number  14    Number of Visits  32    Date for PT Re-Evaluation  08/11/19    Authorization Type  CAID    Authorization - Visit Number  2    Authorization - Number of Visits  9    PT Start Time  1400    PT Stop Time  1455    PT Time Calculation (min)  55 min    Equipment Utilized During Treatment  Gait belt    Activity Tolerance  Patient tolerated treatment well    Behavior During Therapy  [facility] for tasks assessed/performed       Past Medical History:  Diagnosis Date  . ADHD (attention deficit hyperactivity disorder)   . DVT (deep venous thrombosis) (HCC)    Right leg  . Family history of adverse reaction to anesthesia    grandmother had a hard time waking up  . GERD (gastroesophageal reflux disease)   . Headache    migraines  . History of kidney stones   . Hypercholesteremia   . Hypertension   . Seizures ([place])    last seizure at age 47    Past Surgical History:  Procedure Laterality Date  . CHOLECYSTECTOMY  2004  . COLPOSCOPY  01/2014  . COLPOSCOPY  03/2014  . CYSTOSCOPY N/A 02/14/2017   Procedure: CYSTOSCOPY;  Surgeon: Malachy Mood, MD;  Location: [facility];  Service: Gynecology;  Laterality: N/A;  . DILATION AND CURETTAGE OF UTERUS  2015  . LAPAROSCOPIC HYSTERECTOMY Bilateral 02/14/2017   Procedure: HYSTERECTOMY TOTAL LAPAROSCOPIC BILATERAL SALPINGECTOMY;  Surgeon: Malachy Mood, MD;  Location: [facility];  Service: Gynecology;  Laterality: Bilateral;  . LEG SURGERY Bilateral    as a child to correct walking   . TONSILLECTOMY      There were no vitals filed for this visit.  Subjective  Assessment - 06/25/19 1642    Subjective  Patient reports that after last session she was able to ambulate with RW from outside her apartment building to her apartment. She notes she continues to fade reliance on her w/c and AD within the home with good success and awareness for safety. Patient denies any significant concerns.    Currently in Pain?  Yes    Pain Score  4     Pain Location  Knee    Pain Orientation  Right;Left       TREATMENT Neuromuscular Re-education: Gait with RW, x300 feet (about 6 min), 2x 60 feet with SBA. Patient requiring minimal VCs for posture and stride length. // bars:   Static balance with UE challenge (OH reach, chest press, kayak rows) x10 each, SBA, no LOB  4" step taps with SUE support, x10 LLE, 2x5 RLE, SBA/CGA, 1 LOB 2/2 to knee buckling, patient able to recover with CGA  Turning in place, B, x2 with pivot strategy, x2 with stepping strategy, BUE support as needed, CGA  Therapeutic Exercise: NuStep, seat 7, L5-6, spm 50-60 x10 min for improved tolerance to WB and activity, VCs for increased LLE activation Seated BLE LAQ 5# CW , 2x15    Patient educated throughout session on appropriate technique and  form using multi-modal cueing, HEP, and activity modification. Patient articulated understanding and returned demonstration.  Patient Response to interventions: Patient noting some fatigue at end of session.  ASSESSMENT Patient presents to clinic with excellent motivation to participate in therapy. Patient demonstrates deficits in pain, posture, gait, strength, balance, and LLE ROM. Patient performing balance activities with good form and noted ability to recover despite some indication of fear/hesitancy during today's session and responded positively to active interventions. Patient will benefit from continued skilled therapeutic intervention to address remaining deficits in pain, posture, gait, strength, balance, and LLE ROM in order to increase function and  improve overall QOL.    PT Long Term Goals - 06/16/19 1313      PT LONG TERM GOAL #1   Title  Patient will be independent with HEP in order to decrease ankle pain and increase strength in order to improve pain-free function at home and work.    Baseline  4/8: modified IND; 5/18: IND    Time  8    Period  Weeks    Status  New    Target Date  08/11/19      PT LONG TERM GOAL #2   Title  Patient will demonstrate improved function as evidenced by a score of 64 on FOTO measure for full participation in activities at home and in the community.    Baseline  IE: 51; 4/8: 51; 5/18: 40    Time  8    Period  Weeks    Status  On-going    Target Date  08/11/19      PT LONG TERM GOAL #3   Title  Patient will decrease worst pain as reported on NPRS by at least 3 points in order to demonstrate clinically significant reduction in ankle/foot pain.    Baseline  IE: 10; 4/8: 10; 5/18: 7/10    Time  8    Period  Weeks    Status  On-going    Target Date  08/11/19      PT LONG TERM GOAL #4   Title  Patient will improve BERG by at least 3 points in order to demonstrate clinically significant improvement in balance.    Baseline  IE: 25/56; 4/8: 25/56; 5/18:    Time  8    Period  Weeks    Status  On-going    Target Date  08/11/19      PT LONG TERM GOAL #5   Title  Patient will decrease 5TSTS by at least 3 seconds without UE support nor compensatory patterns in order to demonstrate clinically significant improvement in LE strength.    Baseline  IE: 29.8 s BUE support; 4/8: 29.8 s BUE support; 5/18: 25.5 s BUE on thighs    Time  8    Period  Weeks    Status  On-going    Target Date  08/11/19            Plan - 06/25/19 1643    Clinical Impression Statement  Patient presents to clinic with excellent motivation to participate in therapy. Patient demonstrates deficits in pain, posture, gait, strength, balance, and LLE ROM. Patient performing balance activities with good form and noted ability to  recover despite some indication of fear/hesitancy during today's session and responded positively to active interventions. Patient will benefit from continued skilled therapeutic intervention to address remaining deficits in pain, posture, gait, strength, balance, and LLE ROM in order to increase function and improve overall QOL.  Personal Factors and Comorbidities  Age;Education;Sex;Comorbidity 3+;Fitness;Past/Current Experience;Social Background;Finances;Transportation    Comorbidities  ADHD, HTN, hyperlipidemia, migraines, adjustment disorder    Examination-Activity Limitations  Bathing;Dressing;Transfers;Bed Mobility;Lift;Squat;Stairs;Locomotion Level;Stand    Examination-Participation Restrictions  Dentist;Yard Work;Laundry;Cleaning;Community Activity;Shop;Meal Prep    Stability/Clinical Decision Making  Evolving/Moderate complexity    Rehab Potential  Good    PT Frequency  2x / week    PT Duration  8 weeks    PT Treatment/Interventions  ADLs/Self Care Home Management;Cryotherapy;Electrical Stimulation;Moist Heat;Gait training;Stair training;Functional mobility training;Therapeutic activities;Neuromuscular re-education;Balance training;Therapeutic exercise;Patient/family education;Orthotic Fit/Training;Scar mobilization;Manual techniques;Dry needling;Splinting;Taping;Joint Manipulations;Spinal Manipulations;Passive range of motion    PT Next Visit Plan  LLE WB exposure; static/dynamic balance; gait training    PT Home Exercise Plan  continue with prescribed HEP from [place] and Agree with Plan of Care  Patient       Patient will benefit from skilled therapeutic intervention in order to improve the following deficits and impairments:  Abnormal gait, Decreased balance, Decreased endurance, Decreased mobility, Difficulty walking, Hypomobility, Improper body mechanics, Pain, Impaired flexibility, Increased fascial restricitons, Decreased strength, Decreased safety awareness,  Decreased coordination, Decreased activity tolerance, Decreased range of motion, Decreased knowledge of precautions  Visit Diagnosis: Difficulty in walking, not elsewhere classified  Muscle weakness (generalized)  Pain in left lower leg     Problem List Patient Active Problem List   Diagnosis Date Noted  . S/P laparoscopic hysterectomy 02/14/2017   Myles Gip PT, DPT [phone] 06/25/2019, 4:49 PM  [facility] [address] [place], [place], [address] Phone: [phone]   Fax:  [phone]  Name: Ashley Rose MRN: [id number] Date of Birth: 05/04/1975

## 2019-07-02 ENCOUNTER — Encounter: Payer: Self-pay | Admitting: Physical Therapy

## 2019-07-02 ENCOUNTER — Ambulatory Visit: Payer: Medicaid Other | Attending: Orthopedic Surgery | Admitting: Physical Therapy

## 2019-07-02 ENCOUNTER — Other Ambulatory Visit: Payer: Self-pay

## 2019-07-02 DIAGNOSIS — M6281 Muscle weakness (generalized): Secondary | ICD-10-CM | POA: Diagnosis present

## 2019-07-02 DIAGNOSIS — R262 Difficulty in walking, not elsewhere classified: Secondary | ICD-10-CM | POA: Insufficient documentation

## 2019-07-02 DIAGNOSIS — M79662 Pain in left lower leg: Secondary | ICD-10-CM | POA: Insufficient documentation

## 2019-07-02 NOTE — Therapy (Signed)
[facility] [address]. [place], [place], [address] Phone: [phone]   Fax:  [phone]  Physical Therapy Treatment  Patient Details  Name: Ashley Rose MRN: [id number] Date of Birth: 1975-08-21 Referring Provider (PT): Durenda Hurt   Encounter Date: 07/02/2019  PT End of Session - 07/02/19 1314    Visit Number  15    Number of Visits  32    Date for PT Re-Evaluation  08/11/19    Authorization Type  CAID    Authorization - Visit Number  3    Authorization - Number of Visits  9    PT Start Time  [id number]    PT Stop Time  1355    PT Time Calculation (min)  40 min    Equipment Utilized During Treatment  Gait belt    Activity Tolerance  Patient tolerated treatment well    Behavior During Therapy  [facility] for tasks assessed/performed       Past Medical History:  Diagnosis Date  . ADHD (attention deficit hyperactivity disorder)   . DVT (deep venous thrombosis) (HCC)    Right leg  . Family history of adverse reaction to anesthesia    grandmother had a hard time waking up  . GERD (gastroesophageal reflux disease)   . Headache    migraines  . History of kidney stones   . Hypercholesteremia   . Hypertension   . Seizures ([place])    last seizure at age 55    Past Surgical History:  Procedure Laterality Date  . CHOLECYSTECTOMY  2004  . COLPOSCOPY  01/2014  . COLPOSCOPY  03/2014  . CYSTOSCOPY N/A 02/14/2017   Procedure: CYSTOSCOPY;  Surgeon: Malachy Mood, MD;  Location: [facility] ORS;  Service: Gynecology;  Laterality: N/A;  . DILATION AND CURETTAGE OF UTERUS  2015  . LAPAROSCOPIC HYSTERECTOMY Bilateral 02/14/2017   Procedure: HYSTERECTOMY TOTAL LAPAROSCOPIC BILATERAL SALPINGECTOMY;  Surgeon: Malachy Mood, MD;  Location: [facility] ORS;  Service: Gynecology;  Laterality: Bilateral;  . LEG SURGERY Bilateral    as a child to correct walking   . TONSILLECTOMY      There were no vitals filed for this visit.  Subjective  Assessment - 07/02/19 1405    Subjective  Patient presents to clinic late after transport was late picking her up. She notes that since her last session she has been walking small distances from her w/c to her RW in the apartment (<10 steps) and has remained steady doing so. Patient also adds that when the floor neeed to be cleaned she was able to stand against the kitchen counter and mop without unsteadiness/LOB    Currently in Pain?  Yes    Pain Score  5       TREATMENT Therapeutic Exercise: Gait in hallway with RW, SBA, w/c follow. Performed for increased endurance and standing tolerance in following bouts:  4 min on: 3 min off  6 min on: 3 min off  7 min on: 2 min off  7 min on: 1 min off STS faded UE support x5  Patient educated throughout session on appropriate technique and form using multi-modal cueing, HEP, and activity modification. Patient articulated understanding and returned demonstration.  Patient Response to interventions: Patient noting strong determination at end of session.   ASSESSMENT Patient presents to clinic with excellent motivation to participate in therapy. Patient demonstrates deficits in pain, posture, gait, strength, balance, and LLE ROM. Patient tolerated increased gait endurance demands with exceptional motivation  and maintained step-through form during today's session and responded positively to active interventions. Patient will benefit from continued skilled therapeutic intervention to address remaining deficits in pain, posture, gait, strength, balance, and LLE ROM in order to increase function and improve overall QOL.     PT Long Term Goals - 06/16/19 1313      PT LONG TERM GOAL #1   Title  Patient will be independent with HEP in order to decrease ankle pain and increase strength in order to improve pain-free function at home and work.    Baseline  4/8: modified IND; 5/18: IND    Time  8    Period  Weeks    Status  New    Target Date  08/11/19       PT LONG TERM GOAL #2   Title  Patient will demonstrate improved function as evidenced by a score of 64 on FOTO measure for full participation in activities at home and in the community.    Baseline  IE: 51; 4/8: 51; 5/18: 40    Time  8    Period  Weeks    Status  On-going    Target Date  08/11/19      PT LONG TERM GOAL #3   Title  Patient will decrease worst pain as reported on NPRS by at least 3 points in order to demonstrate clinically significant reduction in ankle/foot pain.    Baseline  IE: 10; 4/8: 10; 5/18: 7/10    Time  8    Period  Weeks    Status  On-going    Target Date  08/11/19      PT LONG TERM GOAL #4   Title  Patient will improve BERG by at least 3 points in order to demonstrate clinically significant improvement in balance.    Baseline  IE: 25/56; 4/8: 25/56; 5/18:    Time  8    Period  Weeks    Status  On-going    Target Date  08/11/19      PT LONG TERM GOAL #5   Title  Patient will decrease 5TSTS by at least 3 seconds without UE support nor compensatory patterns in order to demonstrate clinically significant improvement in LE strength.    Baseline  IE: 29.8 s BUE support; 4/8: 29.8 s BUE support; 5/18: 25.5 s BUE on thighs    Time  8    Period  Weeks    Status  On-going    Target Date  08/11/19            Plan - 07/02/19 1314    Clinical Impression Statement  Patient presents to clinic with excellent motivation to participate in therapy. Patient demonstrates deficits in pain, posture, gait, strength, balance, and LLE ROM. Patient tolerated increased gait endurance demands with exceptional motivation and maintained step-through form during today's session and responded positively to active interventions. Patient will benefit from continued skilled therapeutic intervention to address remaining deficits in pain, posture, gait, strength, balance, and LLE ROM in order to increase function and improve overall QOL.    Personal Factors and Comorbidities   Age;Education;Sex;Comorbidity 3+;Fitness;Past/Current Experience;Social Background;Finances;Transportation    Comorbidities  ADHD, HTN, hyperlipidemia, migraines, adjustment disorder    Examination-Activity Limitations  Bathing;Dressing;Transfers;Bed Mobility;Lift;Squat;Stairs;Locomotion Level;Stand    Examination-Participation Restrictions  Dentist;Yard Work;Laundry;Cleaning;Community Activity;Shop;Meal Prep    Stability/Clinical Decision Making  Evolving/Moderate complexity    Rehab Potential  Good    PT Frequency  2x / week  PT Duration  8 weeks    PT Treatment/Interventions  ADLs/Self Care Home Management;Cryotherapy;Electrical Stimulation;Moist Heat;Gait training;Stair training;Functional mobility training;Therapeutic activities;Neuromuscular re-education;Balance training;Therapeutic exercise;Patient/family education;Orthotic Fit/Training;Scar mobilization;Manual techniques;Dry needling;Splinting;Taping;Joint Manipulations;Spinal Manipulations;Passive range of motion    PT Next Visit Plan  outside gait; step training    PT Home Exercise Plan  continue with prescribed HEP from [place] and Agree with Plan of Care  Patient       Patient will benefit from skilled therapeutic intervention in order to improve the following deficits and impairments:  Abnormal gait, Decreased balance, Decreased endurance, Decreased mobility, Difficulty walking, Hypomobility, Improper body mechanics, Pain, Impaired flexibility, Increased fascial restricitons, Decreased strength, Decreased safety awareness, Decreased coordination, Decreased activity tolerance, Decreased range of motion, Decreased knowledge of precautions  Visit Diagnosis: Difficulty in walking, not elsewhere classified  Muscle weakness (generalized)  Pain in left lower leg     Problem List Patient Active Problem List   Diagnosis Date Noted  . S/P laparoscopic hysterectomy 02/14/2017   Myles Gip PT, DPT  [phone] 07/02/2019, 2:11 PM  [facility] [address] [place], [place], [address] Phone: [phone]   Fax:  [phone]  Name: Ashley Rose MRN: [id number] Date of Birth: 10/31/1975

## 2019-07-07 ENCOUNTER — Other Ambulatory Visit: Payer: Self-pay

## 2019-07-07 ENCOUNTER — Ambulatory Visit: Payer: Medicaid Other | Admitting: Physical Therapy

## 2019-07-07 ENCOUNTER — Encounter: Payer: Self-pay | Admitting: Physical Therapy

## 2019-07-07 DIAGNOSIS — M79662 Pain in left lower leg: Secondary | ICD-10-CM

## 2019-07-07 DIAGNOSIS — R262 Difficulty in walking, not elsewhere classified: Secondary | ICD-10-CM | POA: Diagnosis not present

## 2019-07-07 DIAGNOSIS — M6281 Muscle weakness (generalized): Secondary | ICD-10-CM

## 2019-07-07 NOTE — Therapy (Signed)
[facility] [address] [place], [place], [address] Phone: [phone]   Fax:  [phone]  Physical Therapy Treatment  Patient Details  Name: Ashley Rose MRN: [id number] Date of Birth: 01-Feb-1975 Referring Provider (PT): Durenda Hurt   Encounter Date: 07/07/2019  PT End of Session - 07/07/19 1301    Visit Number  16    Number of Visits  32    Date for PT Re-Evaluation  08/11/19    Authorization Type  CAID    Authorization - Visit Number  4    Authorization - Number of Visits  9    PT Start Time  4163    PT Stop Time  1350    PT Time Calculation (min)  55 min    Equipment Utilized During Treatment  Gait belt    Activity Tolerance  Patient tolerated treatment well    Behavior During Therapy  W[facility] for tasks assessed/performed       Past Medical History:  Diagnosis Date   ADHD (attention deficit hyperactivity disorder)    DVT (deep venous thrombosis) (HCC)    Right leg   Family history of adverse reaction to anesthesia    grandmother had a hard time waking up   GERD (gastroesophageal reflux disease)    Headache    migraines   History of kidney stones    Hypercholesteremia    Hypertension    Seizures ([place])    last seizure at age 53    Past Surgical History:  Procedure Laterality Date   CHOLECYSTECTOMY  2004   COLPOSCOPY  01/2014   COLPOSCOPY  03/2014   CYSTOSCOPY N/A 02/14/2017   Procedure: CYSTOSCOPY;  Surgeon: Malachy Mood, MD;  Location: [facility] ORS;  Service: Gynecology;  Laterality: N/A;   DILATION AND CURETTAGE OF UTERUS  2015   LAPAROSCOPIC HYSTERECTOMY Bilateral 02/14/2017   Procedure: HYSTERECTOMY TOTAL LAPAROSCOPIC BILATERAL SALPINGECTOMY;  Surgeon: Malachy Mood, MD;  Location: [facility] ORS;  Service: Gynecology;  Laterality: Bilateral;   LEG SURGERY Bilateral    as a child to correct walking    TONSILLECTOMY      There were no vitals filed for this visit.  Subjective  Assessment - 07/07/19 1259    Subjective  Patient presents to clinic with no significant changes/concerns. Patient denies any falls since last session. Patient eager to work on balance and walking today.    Currently in Pain?  Yes    Pain Score  3     Pain Location  Knee    Pain Orientation  Right;Left       TREATMENT Neuromuscular Re-education: Gait with RW and LLE 5# CW, A453 feet (about 9 min), x245 feet (about 5 min) with SBA. Patient requiring minimal VCs for foot clearance. // bars:   6" step taps with BUE support, x15 LLE, 3x5 RLE, SBA/CGA,   RLE 6" step up with LLE step down, 2x8, SBA/CGA, BUE support  Therapeutic Exercise: NuStep, seat 7, L5-6, spm 50-60 x10 min for improved tolerance to WB and activity, VCs for increased LLE activation Seated LLE strengthening, 5# CW  LAQ, x20  March, x20   Patient educated throughout session on appropriate technique and form using multi-modal cueing, HEP, and activity modification. Patient articulated understanding and returned demonstration.  Patient Response to interventions: Patient eager to walk outside at next session.  ASSESSMENT Patient presents to clinic with excellent motivation to participate in therapy. Patient demonstrates deficits in pain, posture, gait, strength, balance, and  LLE ROM. Patient with excellent control and no LLE spasm with increased duration gait/standing during today's session and responded positively to active interventions. Patient will benefit from continued skilled therapeutic intervention to address remaining deficits in pain, posture, gait, strength, balance, and LLE ROM in order to increase function and improve overall QOL.    PT Long Term Goals - 06/16/19 1313      PT LONG TERM GOAL #1   Title  Patient will be independent with HEP in order to decrease ankle pain and increase strength in order to improve pain-free function at home and work.    Baseline  4/8: modified IND; 5/18: IND    Time  8     Period  Weeks    Status  New    Target Date  08/11/19      PT LONG TERM GOAL #2   Title  Patient will demonstrate improved function as evidenced by a score of 64 on FOTO measure for full participation in activities at home and in the community.    Baseline  IE: 51; 4/8: 51; 5/18: 40    Time  8    Period  Weeks    Status  On-going    Target Date  08/11/19      PT LONG TERM GOAL #3   Title  Patient will decrease worst pain as reported on NPRS by at least 3 points in order to demonstrate clinically significant reduction in ankle/foot pain.    Baseline  IE: 10; 4/8: 10; 5/18: 7/10    Time  8    Period  Weeks    Status  On-going    Target Date  08/11/19      PT LONG TERM GOAL #4   Title  Patient will improve BERG by at least 3 points in order to demonstrate clinically significant improvement in balance.    Baseline  IE: 25/56; 4/8: 25/56; 5/18:    Time  8    Period  Weeks    Status  On-going    Target Date  08/11/19      PT LONG TERM GOAL #5   Title  Patient will decrease 5TSTS by at least 3 seconds without UE support nor compensatory patterns in order to demonstrate clinically significant improvement in LE strength.    Baseline  IE: 29.8 s BUE support; 4/8: 29.8 s BUE support; 5/18: 25.5 s BUE on thighs    Time  8    Period  Weeks    Status  On-going    Target Date  08/11/19            Plan - 07/07/19 1301    Clinical Impression Statement  Patient presents to clinic with excellent motivation to participate in therapy. Patient demonstrates deficits in pain, posture, gait, strength, balance, and LLE ROM. Patient with excellent control and no LLE spasm with increased duration gait/standing during today's session and responded positively to active interventions. Patient will benefit from continued skilled therapeutic intervention to address remaining deficits in pain, posture, gait, strength, balance, and LLE ROM in order to increase function and improve overall QOL.    Personal  Factors and Comorbidities  Age;Education;Sex;Comorbidity 3+;Fitness;Past/Current Experience;Social Background;Finances;Transportation    Comorbidities  ADHD, HTN, hyperlipidemia, migraines, adjustment disorder    Examination-Activity Limitations  Bathing;Dressing;Transfers;Bed Mobility;Lift;Squat;Stairs;Locomotion Level;Stand    Examination-Participation Restrictions  Dentist;Yard Work;Laundry;Cleaning;Community Activity;Shop;Meal Prep    Stability/Clinical Decision Making  Evolving/Moderate complexity    Rehab Potential  Good    PT  Frequency  2x / week    PT Duration  8 weeks    PT Treatment/Interventions  ADLs/Self Care Home Management;Cryotherapy;Electrical Stimulation;Moist Heat;Gait training;Stair training;Functional mobility training;Therapeutic activities;Neuromuscular re-education;Balance training;Therapeutic exercise;Patient/family education;Orthotic Fit/Training;Scar mobilization;Manual techniques;Dry needling;Splinting;Taping;Joint Manipulations;Spinal Manipulations;Passive range of motion    PT Next Visit Plan  outside gait; step training    PT Home Exercise Plan  continue with prescribed HEP from [place] and Agree with Plan of Care  Patient       Patient will benefit from skilled therapeutic intervention in order to improve the following deficits and impairments:  Abnormal gait, Decreased balance, Decreased endurance, Decreased mobility, Difficulty walking, Hypomobility, Improper body mechanics, Pain, Impaired flexibility, Increased fascial restricitons, Decreased strength, Decreased safety awareness, Decreased coordination, Decreased activity tolerance, Decreased range of motion, Decreased knowledge of precautions  Visit Diagnosis: Difficulty in walking, not elsewhere classified  Muscle weakness (generalized)  Pain in left lower leg     Problem List Patient Active Problem List   Diagnosis Date Noted   S/P laparoscopic hysterectomy 02/14/2017   Myles Gip PT, DPT [phone] 07/07/2019, 2:03 PM  [facility] [address] [place], [place], [address] Phone: [phone]   Fax:  [phone]  Name: Ashley Rose MRN: [id number] Date of Birth: 1975-03-06

## 2019-07-09 ENCOUNTER — Other Ambulatory Visit: Payer: Self-pay

## 2019-07-09 ENCOUNTER — Encounter: Payer: Self-pay | Admitting: Physical Therapy

## 2019-07-09 ENCOUNTER — Ambulatory Visit: Payer: Medicaid Other | Admitting: Physical Therapy

## 2019-07-09 DIAGNOSIS — R262 Difficulty in walking, not elsewhere classified: Secondary | ICD-10-CM

## 2019-07-09 DIAGNOSIS — M79662 Pain in left lower leg: Secondary | ICD-10-CM

## 2019-07-09 DIAGNOSIS — M6281 Muscle weakness (generalized): Secondary | ICD-10-CM

## 2019-07-09 NOTE — Therapy (Signed)
[facility] [address] [place], [place], [address] Phone: [phone]   Fax:  [phone]  Physical Therapy Treatment  Patient Details  Name: NOEMI BELLISSIMO MRN: [id number] Date of Birth: [birth year] Referring Provider (PT): Durenda Hurt   Encounter Date: 07/09/2019   PT End of Session - 07/09/19 1320    Visit Number 17    Number of Visits 32    Date for PT Re-Evaluation 08/11/19    Authorization Type CAID    Authorization - Visit Number 5    Authorization - Number of Visits 9    PT Start Time 5993    PT Stop Time 1355    PT Time Calculation (min) 40 min    Equipment Utilized During Treatment Gait belt    Activity Tolerance Patient tolerated treatment well    Behavior During Therapy [facility] for tasks assessed/performed           Past Medical History:  Diagnosis Date  . ADHD (attention deficit hyperactivity disorder)   . DVT (deep venous thrombosis) (HCC)    Right leg  . Family history of adverse reaction to anesthesia    grandmother had a hard time waking up  . GERD (gastroesophageal reflux disease)   . Headache    migraines  . History of kidney stones   . Hypercholesteremia   . Hypertension   . Seizures ([place])    last seizure at age 40    Past Surgical History:  Procedure Laterality Date  . CHOLECYSTECTOMY  2004  . COLPOSCOPY  01/2014  . COLPOSCOPY  03/2014  . CYSTOSCOPY N/A 02/14/2017   Procedure: CYSTOSCOPY;  Surgeon: Malachy Mood, MD;  Location: [facility] ORS;  Service: Gynecology;  Laterality: N/A;  . DILATION AND CURETTAGE OF UTERUS  2015  . LAPAROSCOPIC HYSTERECTOMY Bilateral 02/14/2017   Procedure: HYSTERECTOMY TOTAL LAPAROSCOPIC BILATERAL SALPINGECTOMY;  Surgeon: Malachy Mood, MD;  Location: [facility] ORS;  Service: Gynecology;  Laterality: Bilateral;  . LEG SURGERY Bilateral    as a child to correct walking   . TONSILLECTOMY      There were no vitals filed for this visit.   Subjective  Assessment - 07/09/19 1318    Subjective Patient arrives to clinic about 15 minutes late due to transportation running behind. Patient denies any falls since last session. Patient notes weather today has made her stiff and is wearing her back brace due to lumbar mm aggravation from increased activity in standing/walking.    Currently in Pain? Yes    Pain Score 5     Pain Location Back           TREATMENT Neuromuscular Re-education: Gait with RW and LLE 5# CW, 2x 5 min, SBA/Mod I. Min VCs for stride length and posture. Gait outside clinic with low-grade incline negotiation (descending/ascending), RW, LLE 5# CW, SBA. Min VCs for stride length and posture.  Therapeutic Exercise: NuStep, seat 8, L5-6, spm 50-60 x10 min for improved tolerance to WB and activity, VCs for increased LLE activation Seated LLE strengthening, 5# CW  LAQ, x20  March, x20   Patient educated throughout session on appropriate technique and form using multi-modal cueing, HEP, and activity modification. Patient articulated understanding and returned demonstration.  Patient Response to interventions: Patient experiencing increased back discomfort/pain.  ASSESSMENT Patient presents to clinic with excellent motivation to participate in therapy. Patient demonstrates deficits in pain, posture, gait, strength, balance, and LLE ROM. Patient with excellent control with gait outside including negotiating low  grade in/decline during today's session and responded positively to active interventions. Patient will benefit from continued skilled therapeutic intervention to address remaining deficits in pain, posture, gait, strength, balance, and LLE ROM in order to increase function and improve overall QOL.     PT Long Term Goals - 06/16/19 1313      PT LONG TERM GOAL #1   Title Patient will be independent with HEP in order to decrease ankle pain and increase strength in order to improve pain-free function at home and work.     Baseline 4/8: modified IND; 5/18: IND    Time 8    Period Weeks    Status New    Target Date 08/11/19      PT LONG TERM GOAL #2   Title Patient will demonstrate improved function as evidenced by a score of 64 on FOTO measure for full participation in activities at home and in the community.    Baseline IE: 51; 4/8: 51; 5/18: 40    Time 8    Period Weeks    Status On-going    Target Date 08/11/19      PT LONG TERM GOAL #3   Title Patient will decrease worst pain as reported on NPRS by at least 3 points in order to demonstrate clinically significant reduction in ankle/foot pain.    Baseline IE: 10; 4/8: 10; 5/18: 7/10    Time 8    Period Weeks    Status On-going    Target Date 08/11/19      PT LONG TERM GOAL #4   Title Patient will improve BERG by at least 3 points in order to demonstrate clinically significant improvement in balance.    Baseline IE: 25/56; 4/8: 25/56; 5/18:    Time 8    Period Weeks    Status On-going    Target Date 08/11/19      PT LONG TERM GOAL #5   Title Patient will decrease 5TSTS by at least 3 seconds without UE support nor compensatory patterns in order to demonstrate clinically significant improvement in LE strength.    Baseline IE: 29.8 s BUE support; 4/8: 29.8 s BUE support; 5/18: 25.5 s BUE on thighs    Time 8    Period Weeks    Status On-going    Target Date 08/11/19                 Plan - 07/09/19 1322    Clinical Impression Statement Patient presents to clinic with excellent motivation to participate in therapy. Patient demonstrates deficits in pain, posture, gait, strength, balance, and LLE ROM. Patient with excellent control with gait outside including negotiating low grade in/decline during today's session and responded positively to active interventions. Patient will benefit from continued skilled therapeutic intervention to address remaining deficits in pain, posture, gait, strength, balance, and LLE ROM in order to increase function  and improve overall QOL.    Personal Factors and Comorbidities Age;Education;Sex;Comorbidity 3+;Fitness;Past/Current Experience;Social Background;Finances;Transportation    Comorbidities ADHD, HTN, hyperlipidemia, migraines, adjustment disorder    Examination-Activity Limitations Bathing;Dressing;Transfers;Bed Mobility;Lift;Squat;Stairs;Locomotion Level;Stand    Examination-Participation Restrictions Dentist;Yard Work;Laundry;Cleaning;Community Activity;Shop;Meal Prep    Stability/Clinical Decision Making Evolving/Moderate complexity    Rehab Potential Good    PT Frequency 2x / week    PT Duration 8 weeks    PT Treatment/Interventions ADLs/Self Care Home Management;Cryotherapy;Electrical Stimulation;Moist Heat;Gait training;Stair training;Functional mobility training;Therapeutic activities;Neuromuscular re-education;Balance training;Therapeutic exercise;Patient/family education;Orthotic Fit/Training;Scar mobilization;Manual techniques;Dry needling;Splinting;Taping;Joint Manipulations;Spinal Manipulations;Passive range of motion  PT Next Visit Plan outside gait; step training    PT Home Exercise Plan continue with prescribed HEP from [place] with Plan of Care Patient           Patient will benefit from skilled therapeutic intervention in order to improve the following deficits and impairments:  Abnormal gait, Decreased balance, Decreased endurance, Decreased mobility, Difficulty walking, Hypomobility, Improper body mechanics, Pain, Impaired flexibility, Increased fascial restricitons, Decreased strength, Decreased safety awareness, Decreased coordination, Decreased activity tolerance, Decreased range of motion, Decreased knowledge of precautions  Visit Diagnosis: Difficulty in walking, not elsewhere classified  Muscle weakness (generalized)  Pain in left lower leg     Problem List Patient Active Problem List   Diagnosis Date Noted  . S/P laparoscopic  hysterectomy 02/14/2017   Myles Gip PT, DPT [phone]  07/09/2019, 5:00 PM  [facility] [address] [place], [place], [address] Phone: [phone]   Fax:  [phone]  Name: TARAJI MUNGO MRN: [id number] Date of Birth: 04-25-1975

## 2019-07-14 ENCOUNTER — Encounter: Payer: Self-pay | Admitting: Physical Therapy

## 2019-07-14 ENCOUNTER — Ambulatory Visit: Payer: Medicaid Other | Admitting: Physical Therapy

## 2019-07-14 ENCOUNTER — Other Ambulatory Visit: Payer: Self-pay

## 2019-07-14 DIAGNOSIS — R262 Difficulty in walking, not elsewhere classified: Secondary | ICD-10-CM

## 2019-07-14 DIAGNOSIS — M6281 Muscle weakness (generalized): Secondary | ICD-10-CM

## 2019-07-14 DIAGNOSIS — M79662 Pain in left lower leg: Secondary | ICD-10-CM

## 2019-07-14 NOTE — Therapy (Signed)
[facility]  [facility] [address] [place], [place], [address] Phone: [phone]   Fax:  [phone]  Physical Therapy Treatment  Patient Details  Name: Ashley Rose MRN: [id number] Date of Birth: [birth year] Referring Provider (PT): Durenda Hurt   Encounter Date: 07/14/2019   PT End of Session - 07/14/19 1317    Visit Number 18    Number of Visits 32    Date for PT Re-Evaluation 08/11/19    Authorization Type CAID    Authorization - Visit Number 6    Authorization - Number of Visits 9    PT Start Time 1300    PT Stop Time 2836    PT Time Calculation (min) 55 min    Equipment Utilized During Treatment Gait belt    Activity Tolerance Patient tolerated treatment well    Behavior During Therapy [facility] for tasks assessed/performed           Past Medical History:  Diagnosis Date  . ADHD (attention deficit hyperactivity disorder)   . DVT (deep venous thrombosis) (HCC)    Right leg  . Family history of adverse reaction to anesthesia    grandmother had a hard time waking up  . GERD (gastroesophageal reflux disease)   . Headache    migraines  . History of kidney stones   . Hypercholesteremia   . Hypertension   . Seizures ([place])    last seizure at age 3    Past Surgical History:  Procedure Laterality Date  . CHOLECYSTECTOMY  2004  . COLPOSCOPY  01/2014  . COLPOSCOPY  03/2014  . CYSTOSCOPY N/A 02/14/2017   Procedure: CYSTOSCOPY;  Surgeon: Malachy Mood, MD;  Location: [facility] ORS;  Service: Gynecology;  Laterality: N/A;  . DILATION AND CURETTAGE OF UTERUS  2015  . LAPAROSCOPIC HYSTERECTOMY Bilateral 02/14/2017   Procedure: HYSTERECTOMY TOTAL LAPAROSCOPIC BILATERAL SALPINGECTOMY;  Surgeon: Malachy Mood, MD;  Location: [facility] ORS;  Service: Gynecology;  Laterality: Bilateral;  . LEG SURGERY Bilateral    as a child to correct walking   . TONSILLECTOMY      There were no vitals filed for this visit.   Subjective  Assessment - 07/14/19 1310    Subjective Patient presents to clinic with complaints of persistent back pain since starting to increase walking duration. Patient notes that she is not wearing her back brace today but that her back is bothering her (6/10 on arrival with 3/10 during nu-step).    Currently in Pain? Yes    Pain Score 3     Pain Location Back    Pain Orientation Lower;Mid          TREATMENT  Therapeutic Exercise: NuStep, seat 9, L5-6, spm 50-60 x10 min for improved tolerance to WB and activity, VCs for increased LLE activation RLE 6" step up 2x10, BUE, CGA/SBA LLE 4" step up 2x10, BUE, CGA/SBA BLE 2 stair training (4", 2") x10, BUE, CGA/SBA   Patient educated throughout session on appropriate technique and form using multi-modal cueing, HEP, and activity modification. Patient articulated understanding and returned demonstration.  Patient Response to interventions: Patient notes she is going to try to walk up the ramp upon returning to her apartment.  ASSESSMENT Patient presents to clinic with excellent motivation to participate in therapy. Patient demonstrates deficits in pain, posture, gait, strength, balance, and LLE ROM. Patient able to control L quad for ascent and descent of 4" step with good consistency and BUE support during today's session and responded positively  to active interventions. Patient will benefit from continued skilled therapeutic intervention to address remaining deficits in pain, posture, gait, strength, balance, and LLE ROM in order to increase function and improve overall QOL.     PT Long Term Goals - 06/16/19 1313      PT LONG TERM GOAL #1   Title Patient will be independent with HEP in order to decrease ankle pain and increase strength in order to improve pain-free function at home and work.    Baseline 4/8: modified IND; 5/18: IND    Time 8    Period Weeks    Status New    Target Date 08/11/19      PT LONG TERM GOAL #2   Title Patient  will demonstrate improved function as evidenced by a score of 64 on FOTO measure for full participation in activities at home and in the community.    Baseline IE: 51; 4/8: 51; 5/18: 40    Time 8    Period Weeks    Status On-going    Target Date 08/11/19      PT LONG TERM GOAL #3   Title Patient will decrease worst pain as reported on NPRS by at least 3 points in order to demonstrate clinically significant reduction in ankle/foot pain.    Baseline IE: 10; 4/8: 10; 5/18: 7/10    Time 8    Period Weeks    Status On-going    Target Date 08/11/19      PT LONG TERM GOAL #4   Title Patient will improve BERG by at least 3 points in order to demonstrate clinically significant improvement in balance.    Baseline IE: 25/56; 4/8: 25/56; 5/18:    Time 8    Period Weeks    Status On-going    Target Date 08/11/19      PT LONG TERM GOAL #5   Title Patient will decrease 5TSTS by at least 3 seconds without UE support nor compensatory patterns in order to demonstrate clinically significant improvement in LE strength.    Baseline IE: 29.8 s BUE support; 4/8: 29.8 s BUE support; 5/18: 25.5 s BUE on thighs    Time 8    Period Weeks    Status On-going    Target Date 08/11/19                 Plan - 07/14/19 1317    Clinical Impression Statement Patient presents to clinic with excellent motivation to participate in therapy. Patient demonstrates deficits in pain, posture, gait, strength, balance, and LLE ROM. Patient able to control L quad for ascent and descent of 4" step with good consistency and BUE support during today's session and responded positively to active interventions. Patient will benefit from continued skilled therapeutic intervention to address remaining deficits in pain, posture, gait, strength, balance, and LLE ROM in order to increase function and improve overall QOL.    Personal Factors and Comorbidities Age;Education;Sex;Comorbidity 3+;Fitness;Past/Current Experience;Social  Background;Finances;Transportation    Comorbidities ADHD, HTN, hyperlipidemia, migraines, adjustment disorder    Examination-Activity Limitations Bathing;Dressing;Transfers;Bed Mobility;Lift;Squat;Stairs;Locomotion Level;Stand    Examination-Participation Restrictions Dentist;Yard Work;Laundry;Cleaning;Community Activity;Shop;Meal Prep    Stability/Clinical Decision Making Evolving/Moderate complexity    Rehab Potential Good    PT Frequency 2x / week    PT Duration 8 weeks    PT Treatment/Interventions ADLs/Self Care Home Management;Cryotherapy;Electrical Stimulation;Moist Heat;Gait training;Stair training;Functional mobility training;Therapeutic activities;Neuromuscular re-education;Balance training;Therapeutic exercise;Patient/family education;Orthotic Fit/Training;Scar mobilization;Manual techniques;Dry needling;Splinting;Taping;Joint Manipulations;Spinal Manipulations;Passive range of motion    PT  Next Visit Plan outside gait; step training    PT Home Exercise Plan continue with prescribed HEP from [place] and Agree with Plan of Care Patient           Patient will benefit from skilled therapeutic intervention in order to improve the following deficits and impairments:  Abnormal gait, Decreased balance, Decreased endurance, Decreased mobility, Difficulty walking, Hypomobility, Improper body mechanics, Pain, Impaired flexibility, Increased fascial restricitons, Decreased strength, Decreased safety awareness, Decreased coordination, Decreased activity tolerance, Decreased range of motion, Decreased knowledge of precautions  Visit Diagnosis: Difficulty in walking, not elsewhere classified  Muscle weakness (generalized)  Pain in left lower leg     Problem List Patient Active Problem List   Diagnosis Date Noted  . S/P laparoscopic hysterectomy 02/14/2017   Myles Gip PT, DPT [phone] 07/14/2019, 2:18 PM  [facility] [address] [place], [place], [address] Phone: [phone]   Fax:  [phone]  Name: Ashley Rose MRN: [id number] Date of Birth: 10-04-1975

## 2019-07-16 ENCOUNTER — Ambulatory Visit: Payer: Medicaid Other | Admitting: Physical Therapy

## 2019-07-16 ENCOUNTER — Other Ambulatory Visit: Payer: Self-pay

## 2019-07-16 ENCOUNTER — Encounter: Payer: Self-pay | Admitting: Physical Therapy

## 2019-07-16 DIAGNOSIS — R262 Difficulty in walking, not elsewhere classified: Secondary | ICD-10-CM

## 2019-07-16 DIAGNOSIS — M79662 Pain in left lower leg: Secondary | ICD-10-CM

## 2019-07-16 DIAGNOSIS — M6281 Muscle weakness (generalized): Secondary | ICD-10-CM

## 2019-07-16 NOTE — Therapy (Signed)
[facility] [address] [place], [place], [address] Phone: [phone]   Fax:  [phone]  Physical Therapy Treatment  Patient Details  Name: Ashley Rose MRN: [id number] Date of Birth: 1975/11/27 Referring Provider (PT): Durenda Hurt   Encounter Date: 07/16/2019   PT End of Session - 07/16/19 1318    Visit Number 19    Number of Visits 32    Date for PT Re-Evaluation 08/11/19    Authorization Type CAID    Authorization - Visit Number 7    Authorization - Number of Visits 9    PT Start Time 6962    PT Stop Time 1400    PT Time Calculation (min) 53 min    Equipment Utilized During Treatment Gait belt    Activity Tolerance Patient tolerated treatment well    Behavior During Therapy [facility] for tasks assessed/performed           Past Medical History:  Diagnosis Date  . ADHD (attention deficit hyperactivity disorder)   . DVT (deep venous thrombosis) (HCC)    Right leg  . Family history of adverse reaction to anesthesia    grandmother had a hard time waking up  . GERD (gastroesophageal reflux disease)   . Headache    migraines  . History of kidney stones   . Hypercholesteremia   . Hypertension   . Seizures ([place])    last seizure at age 71    Past Surgical History:  Procedure Laterality Date  . CHOLECYSTECTOMY  2004  . COLPOSCOPY  01/2014  . COLPOSCOPY  03/2014  . CYSTOSCOPY N/A 02/14/2017   Procedure: CYSTOSCOPY;  Surgeon: Malachy Mood, MD;  Location: [facility] ORS;  Service: Gynecology;  Laterality: N/A;  . DILATION AND CURETTAGE OF UTERUS  2015  . LAPAROSCOPIC HYSTERECTOMY Bilateral 02/14/2017   Procedure: HYSTERECTOMY TOTAL LAPAROSCOPIC BILATERAL SALPINGECTOMY;  Surgeon: Malachy Mood, MD;  Location: [facility] ORS;  Service: Gynecology;  Laterality: Bilateral;  . LEG SURGERY Bilateral    as a child to correct walking   . TONSILLECTOMY      There were no vitals filed for this visit.   Subjective  Assessment - 07/16/19 1315    Subjective Patient notes that after last session she was able to walk down the hall of her apartment building to her apartment. Patient is not wearing back brace today but notes that her back pain is not overwhelming today.    Currently in Pain? Yes    Pain Score 4     Pain Location Back            TREATMENT  Therapeutic Exercise: NuStep, seat 8, L5-6, spm 50-60 x10 min for improved tolerance to WB and activity, VCs for increased LLE activation Gait with RW and LLE 5# CW, x9 min, x11 min, Mod I. No VCs needed for posture. W/c follow for safety.    Patient educated throughout session on appropriate technique and form using multi-modal cueing, HEP, and activity modification. Patient articulated understanding and returned demonstration.  Patient Response to interventions: Patient reporting some increased discomfort in back from increased standing/walking.  ASSESSMENT Patient presents to clinic with excellent motivation to participate in therapy. Patient demonstrates deficits in pain, posture, gait, strength, balance, and LLE ROM. Patient tolerating increased walking duration with good form and minimal LLE spasticity during today's session and responded positively to active interventions. Patient will benefit from continued skilled therapeutic intervention to address remaining deficits in pain, posture, gait, strength, balance,  and LLE ROM in order to increase function and improve overall QOL.      PT Long Term Goals - 06/16/19 1313      PT LONG TERM GOAL #1   Title Patient will be independent with HEP in order to decrease ankle pain and increase strength in order to improve pain-free function at home and work.    Baseline 4/8: modified IND; 5/18: IND    Time 8    Period Weeks    Status New    Target Date 08/11/19      PT LONG TERM GOAL #2   Title Patient will demonstrate improved function as evidenced by a score of 64 on FOTO measure for full  participation in activities at home and in the community.    Baseline IE: 51; 4/8: 51; 5/18: 40    Time 8    Period Weeks    Status On-going    Target Date 08/11/19      PT LONG TERM GOAL #3   Title Patient will decrease worst pain as reported on NPRS by at least 3 points in order to demonstrate clinically significant reduction in ankle/foot pain.    Baseline IE: 10; 4/8: 10; 5/18: 7/10    Time 8    Period Weeks    Status On-going    Target Date 08/11/19      PT LONG TERM GOAL #4   Title Patient will improve BERG by at least 3 points in order to demonstrate clinically significant improvement in balance.    Baseline IE: 25/56; 4/8: 25/56; 5/18:    Time 8    Period Weeks    Status On-going    Target Date 08/11/19      PT LONG TERM GOAL #5   Title Patient will decrease 5TSTS by at least 3 seconds without UE support nor compensatory patterns in order to demonstrate clinically significant improvement in LE strength.    Baseline IE: 29.8 s BUE support; 4/8: 29.8 s BUE support; 5/18: 25.5 s BUE on thighs    Time 8    Period Weeks    Status On-going    Target Date 08/11/19                 Plan - 07/16/19 1318    Clinical Impression Statement Patient presents to clinic with excellent motivation to participate in therapy. Patient demonstrates deficits in pain, posture, gait, strength, balance, and LLE ROM. Patient tolerating increased walking duration with good form and minimal LLE spasticity during today's session and responded positively to active interventions. Patient will benefit from continued skilled therapeutic intervention to address remaining deficits in pain, posture, gait, strength, balance, and LLE ROM in order to increase function and improve overall QOL.    Personal Factors and Comorbidities Age;Education;Sex;Comorbidity 3+;Fitness;Past/Current Experience;Social Background;Finances;Transportation    Comorbidities ADHD, HTN, hyperlipidemia, migraines, adjustment  disorder    Examination-Activity Limitations Bathing;Dressing;Transfers;Bed Mobility;Lift;Squat;Stairs;Locomotion Level;Stand    Examination-Participation Restrictions Dentist;Yard Work;Laundry;Cleaning;Community Activity;Shop;Meal Prep    Stability/Clinical Decision Making Evolving/Moderate complexity    Rehab Potential Good    PT Frequency 2x / week    PT Duration 8 weeks    PT Treatment/Interventions ADLs/Self Care Home Management;Cryotherapy;Electrical Stimulation;Moist Heat;Gait training;Stair training;Functional mobility training;Therapeutic activities;Neuromuscular re-education;Balance training;Therapeutic exercise;Patient/family education;Orthotic Fit/Training;Scar mobilization;Manual techniques;Dry needling;Splinting;Taping;Joint Manipulations;Spinal Manipulations;Passive range of motion    PT Next Visit Plan outside gait; step training    PT Home Exercise Plan continue with prescribed HEP from [place] and Agree with  Plan of Care Patient           Patient will benefit from skilled therapeutic intervention in order to improve the following deficits and impairments:  Abnormal gait, Decreased balance, Decreased endurance, Decreased mobility, Difficulty walking, Hypomobility, Improper body mechanics, Pain, Impaired flexibility, Increased fascial restricitons, Decreased strength, Decreased safety awareness, Decreased coordination, Decreased activity tolerance, Decreased range of motion, Decreased knowledge of precautions  Visit Diagnosis: Difficulty in walking, not elsewhere classified  Muscle weakness (generalized)  Pain in left lower leg     Problem List Patient Active Problem List   Diagnosis Date Noted  . S/P laparoscopic hysterectomy 02/14/2017   Myles Gip PT, DPT [phone] 07/16/2019, 2:35 PM  [facility] [address] [place], [place], [address] Phone: [phone]   Fax:  [phone]  Name:  CHRISTABEL CAMIRE MRN: [id number] Date of Birth: [birth year]

## 2019-07-21 ENCOUNTER — Other Ambulatory Visit: Payer: Self-pay

## 2019-07-21 ENCOUNTER — Ambulatory Visit: Payer: Medicaid Other | Admitting: Physical Therapy

## 2019-07-21 DIAGNOSIS — R262 Difficulty in walking, not elsewhere classified: Secondary | ICD-10-CM | POA: Diagnosis not present

## 2019-07-21 DIAGNOSIS — M79662 Pain in left lower leg: Secondary | ICD-10-CM

## 2019-07-21 DIAGNOSIS — M6281 Muscle weakness (generalized): Secondary | ICD-10-CM

## 2019-07-21 NOTE — Therapy (Signed)
[facility] [address] [place], [place], [address] Phone: [phone]   Fax:  [phone]  Physical Therapy Treatment  Patient Details  Name: Ashley Rose MRN: [id number] Date of Birth: 12-21-1975 Referring Provider (PT): Durenda Hurt   Encounter Date: 07/21/2019   PT End of Session - 07/21/19 1409    Visit Number 20    Number of Visits 32    Date for PT Re-Evaluation 08/11/19    Authorization Type CAID    Authorization - Visit Number 8    Authorization - Number of Visits 9    PT Start Time 2336    PT Stop Time 1224    PT Time Calculation (min) 60 min    Equipment Utilized During Treatment Gait belt    Activity Tolerance Patient tolerated treatment well    Behavior During Therapy [facility] for tasks assessed/performed           Past Medical History:  Diagnosis Date  . ADHD (attention deficit hyperactivity disorder)   . DVT (deep venous thrombosis) (HCC)    Right leg  . Family history of adverse reaction to anesthesia    grandmother had a hard time waking up  . GERD (gastroesophageal reflux disease)   . Headache    migraines  . History of kidney stones   . Hypercholesteremia   . Hypertension   . Seizures ([place])    last seizure at age 40    Past Surgical History:  Procedure Laterality Date  . CHOLECYSTECTOMY  2004  . COLPOSCOPY  01/2014  . COLPOSCOPY  03/2014  . CYSTOSCOPY N/A 02/14/2017   Procedure: CYSTOSCOPY;  Surgeon: Malachy Mood, MD;  Location: [facility] ORS;  Service: Gynecology;  Laterality: N/A;  . DILATION AND CURETTAGE OF UTERUS  2015  . LAPAROSCOPIC HYSTERECTOMY Bilateral 02/14/2017   Procedure: HYSTERECTOMY TOTAL LAPAROSCOPIC BILATERAL SALPINGECTOMY;  Surgeon: Malachy Mood, MD;  Location: [facility] OR[facility];  Service: Gynecology;  Laterality: Bilateral;  . LEG SURGERY Bilateral    as a child to correct walking   . TONSILLECTOMY      There were no vitals filed for this visit.   Subjective  Assessment - 07/21/19 1406    Subjective Patient states that she had a nice weekend and has been walking more. She was able to do some walking around [employer] without incident using her w/c as a rollator. Patient notes that she was even able to load her w/c in the trunk of the car and independently navigate to her car door and enter. Patient does note that she continues to have back pain from increased standing duration, but achieves some relief by way of a back support/brace.    Currently in Pain? Yes    Pain Score 5     Pain Location Knee    Pain Orientation Right;Left              [facility] PT Assessment - 07/21/19 0001      Berg Balance Test   Sit to Stand Able to stand without using hands and stabilize independently    Standing Unsupported Able to stand safely 2 minutes    Sitting with Back Unsupported but Feet Supported on Floor or Stool Able to sit safely and securely 2 minutes    Stand to Sit Sits safely with minimal use of hands    Transfers Able to transfer safely, minor use of hands    Standing Unsupported with Eyes Closed Able to stand 10 seconds  safely    Standing Unsupported with Feet Together Able to place feet together independently and stand for 1 minute with supervision    From Standing, Reach Forward with Outstretched Arm Can reach confidently >25 cm (10")    From Standing Position, Pick up Object from [place] to pick up shoe safely and easily    From Standing Position, Turn to Look Behind Over each Shoulder Looks behind from both sides and weight shifts well    Turn 360 Degrees Able to turn 360 degrees safely but slowly    Standing Unsupported, Alternately Place Feet on Step/Stool Able to complete 4 steps without aid or supervision    Standing Unsupported, One Foot in [place] to take small step independently and hold 30 seconds    Standing on One Leg Able to lift leg independently and hold equal to or more than 3 seconds    Total Score 47    Berg comment: AD  outdoor           TREATMENT  Therapeutic Exercise: NuStep, seat 9, L5-6, spm 50-60 x10 min for improved tolerance to WB and activity, VCs for increased LLE activation RLE 6" step up 2x10, BUE, SBA LLE 6" step up 2x10, BUE, CGA/SBA Gait in clinic, RW, SBA/Mod I 3x 75 feet   Patient educated throughout session on appropriate technique and form using multi-modal cueing, HEP, and activity modification. Patient articulated understanding and returned demonstration.  Patient Response to interventions: Patient does not report significant increased pain.   ASSESSMENT Patient presents to clinic with excellent motivation to participate in therapy. Patient demonstrates deficits in pain, posture, gait, strength, balance, and LLE ROM. Patient able to control L quad for ascent and descent of 6" step with good consistency and BUE support during today's session as well as continuing to progress toward/beyond goals with positive response to active interventions. Patient's condition has the potential to improve in response to therapy. Maximum improvement is yet to be obtained. The anticipated improvement is attainable and reasonable in a generally predictable time. Patient will benefit from continued skilled therapeutic intervention to address remaining deficits in pain, posture, gait, strength, balance, and LLE ROM in order to increase function and improve overall QOL.     PT Long Term Goals - 07/21/19 1410      PT LONG TERM GOAL #1   Title Patient will be independent with HEP in order to decrease ankle pain and increase strength in order to improve pain-free function at home and work.    Baseline 4/8: modified IND; 5/18: IND; 6/22: IND    Time 8    Period Weeks    Status Achieved      PT LONG TERM GOAL #2   Title Patient will demonstrate improved function as evidenced by a score of 64 on FOTO measure for full participation in activities at home and in the community.    Baseline IE: 51; 4/8: 51;  5/18: 40; 6/22: deferred 2/2 to technical issues    Time 8    Period Weeks    Status On-going    Target Date 08/11/19      PT LONG TERM GOAL #3   Title Patient will decrease worst pain as reported on NPRS by at least 3 points in order to demonstrate clinically significant reduction in ankle/foot pain.    Baseline IE: 10; 4/8: 10; 5/18: 7/10; 6/22: 5/10    Time 8    Period Weeks    Status On-going  Target Date 08/11/19      PT LONG TERM GOAL #4   Title Patient will improve BERG by at least 3 points in order to demonstrate clinically significant improvement in balance.    Baseline IE: 25/56; 4/8: 25/56; 5/18: 38/56; 6/22: 47/56    Time 8    Period Weeks    Status Partially Met    Target Date 08/11/19      PT LONG TERM GOAL #5   Title Patient will decrease 5TSTS by at least 3 seconds without UE support nor compensatory patterns in order to demonstrate clinically significant improvement in LE strength.    Baseline IE: 29.8 s BUE support; 4/8: 29.8 s BUE support; 5/18: 25.5 s BUE on thighs; 6/22: 19.9 sec BUE on thighs    Time 8    Period Weeks    Status Partially Met    Target Date 08/11/19                 Plan - 07/21/19 1409    Clinical Impression Statement Patient presents to clinic with excellent motivation to participate in therapy. Patient demonstrates deficits in pain, posture, gait, strength, balance, and LLE ROM. Patient able to control L quad for ascent and descent of 6" step with good consistency and BUE support during today's session as well as continuing to progress toward/beyond goals with positive response to active interventions. Patient's condition has the potential to improve in response to therapy. Maximum improvement is yet to be obtained. The anticipated improvement is attainable and reasonable in a generally predictable time. Patient will benefit from continued skilled therapeutic intervention to address remaining deficits in pain, posture, gait, strength,  balance, and LLE ROM in order to increase function and improve overall QOL.    Personal Factors and Comorbidities Age;Education;Sex;Comorbidity 3+;Fitness;Past/Current Experience;Social Background;Finances;Transportation    Comorbidities ADHD, HTN, hyperlipidemia, migraines, adjustment disorder    Examination-Activity Limitations Bathing;Dressing;Transfers;Bed Mobility;Lift;Squat;Stairs;Locomotion Level;Stand    Examination-Participation Restrictions Dentist;Yard Work;Laundry;Cleaning;Community Activity;Shop;Meal Prep    Stability/Clinical Decision Making Evolving/Moderate complexity    Rehab Potential Good    PT Frequency 2x / week    PT Duration 8 weeks    PT Treatment/Interventions ADLs/Self Care Home Management;Cryotherapy;Electrical Stimulation;Moist Heat;Gait training;Stair training;Functional mobility training;Therapeutic activities;Neuromuscular re-education;Balance training;Therapeutic exercise;Patient/family education;Orthotic Fit/Training;Scar mobilization;Manual techniques;Dry needling;Splinting;Taping;Joint Manipulations;Spinal Manipulations;Passive range of motion    PT Next Visit Plan outside gait; step training    PT Home Exercise Plan continue with prescribed HEP from [place] and Agree with Plan of Care Patient           Patient will benefit from skilled therapeutic intervention in order to improve the following deficits and impairments:  Abnormal gait, Decreased balance, Decreased endurance, Decreased mobility, Difficulty walking, Hypomobility, Improper body mechanics, Pain, Impaired flexibility, Increased fascial restricitons, Decreased strength, Decreased safety awareness, Decreased coordination, Decreased activity tolerance, Decreased range of motion, Decreased knowledge of precautions  Visit Diagnosis: Difficulty in walking, not elsewhere classified  Muscle weakness (generalized)  Pain in left lower leg     Problem List Patient Active Problem  List   Diagnosis Date Noted  . S/P laparoscopic hysterectomy 02/14/2017   Myles Gip PT, DPT [phone] 07/21/2019, 2:19 PM  [facility] [address] [place], [place], [address] Phone: [phone]   Fax:  [phone]  Name: Ashley Rose MRN: [id number] Date of Birth: 12/08/1975

## 2019-07-23 ENCOUNTER — Other Ambulatory Visit: Payer: Self-pay

## 2019-07-23 ENCOUNTER — Encounter: Payer: Self-pay | Admitting: Physical Therapy

## 2019-07-23 ENCOUNTER — Ambulatory Visit: Payer: Medicaid Other | Admitting: Physical Therapy

## 2019-07-23 DIAGNOSIS — M79662 Pain in left lower leg: Secondary | ICD-10-CM

## 2019-07-23 DIAGNOSIS — M6281 Muscle weakness (generalized): Secondary | ICD-10-CM

## 2019-07-23 DIAGNOSIS — R262 Difficulty in walking, not elsewhere classified: Secondary | ICD-10-CM | POA: Diagnosis not present

## 2019-07-23 NOTE — Therapy (Signed)
[place] [address] [place], [place], [address] Phone: [phone]   Fax:  [phone]  Physical Therapy Treatment  Patient Details  Name: Ashley Rose MRN: [id number] Date of Birth: 06-20-1975 Referring Provider (PT): Durenda Hurt   Encounter Date: 07/23/2019   PT End of Session - 07/23/19 1353    Visit Number 21    Number of Visits 32    Date for PT Re-Evaluation 08/11/19    Authorization Type CAID    Authorization - Visit Number 9    Authorization - Number of Visits 9    PT Start Time 6144    PT Stop Time 1350    PT Time Calculation (min) 55 min    Equipment Utilized During Treatment Gait belt    Activity Tolerance Patient tolerated treatment well    Behavior During Therapy WFL for tasks assessed/performed           Past Medical History:  Diagnosis Date   ADHD (attention deficit hyperactivity disorder)    DVT (deep venous thrombosis) (HCC)    Right leg   Family history of adverse reaction to anesthesia    grandmother had a hard time waking up   GERD (gastroesophageal reflux disease)    Headache    migraines   History of kidney stones    Hypercholesteremia    Hypertension    Seizures ([place])    last seizure at age 77    Past Surgical History:  Procedure Laterality Date   CHOLECYSTECTOMY  2004   COLPOSCOPY  01/2014   COLPOSCOPY  03/2014   CYSTOSCOPY N/A 02/14/2017   Procedure: CYSTOSCOPY;  Surgeon: Malachy Mood, MD;  Location: [facility];  Service: Gynecology;  Laterality: N/A;   DILATION AND CURETTAGE OF UTERUS  2015   LAPAROSCOPIC HYSTERECTOMY Bilateral 02/14/2017   Procedure: HYSTERECTOMY TOTAL LAPAROSCOPIC BILATERAL SALPINGECTOMY;  Surgeon: Malachy Mood, MD;  Location: [facility] OR[facility];  Service: Gynecology;  Laterality: Bilateral;   LEG SURGERY Bilateral    as a child to correct walking    TONSILLECTOMY      There were no vitals filed for this visit.   Subjective  Assessment - 07/23/19 1655    Subjective Patient reports that she is feeling lower energy today because of some allergy congestion. Patient reports some mild discomfort in her back after last session, but notes it was manageable.             TREATMENT  Therapeutic Exercise: NuStep, seat 9, L7-8, spm 50-60 x10 min for improved tolerance to WB and activity, VCs for increased LLE activation RLE 6" step up x10, BUE, SBA LLE 6" step up x5, BUE, CGA/SBA Gait outside clinic with varied surfaces, RW, Mod I with w/c follow, 10 min (~400 feet), 6 min (~450 feet)   Patient educated throughout session on appropriate technique and form using multi-modal cueing, HEP, and activity modification. Patient articulated understanding and returned demonstration.  Patient Response to interventions: Patient does not report significant increased pain.   ASSESSMENT Patient presents to clinic with excellent motivation to participate in therapy. Patient demonstrates deficits in pain, posture, gait, strength, balance, and LLE ROM. Patient able to walk 850 feet with brief sitting rest break outside clinic with varied surfaces using RW during today's session and had a positive response to active interventions. Patient will benefit from continued skilled therapeutic intervention to address remaining deficits in pain, posture, gait, strength, balance, and LLE ROM in order to increase function and improve  overall QOL.      PT Long Term Goals - 07/21/19 1410      PT LONG TERM GOAL #1   Title Patient will be independent with HEP in order to decrease ankle pain and increase strength in order to improve pain-free function at home and work.    Baseline 4/8: modified IND; 5/18: IND; 6/22: IND    Time 8    Period Weeks    Status Achieved      PT LONG TERM GOAL #2   Title Patient will demonstrate improved function as evidenced by a score of 64 on FOTO measure for full participation in activities at home and in the  community.    Baseline IE: 51; 4/8: 51; 5/18: 40; 6/22: deferred 2/2 to technical issues    Time 8    Period Weeks    Status On-going    Target Date 08/11/19      PT LONG TERM GOAL #3   Title Patient will decrease worst pain as reported on NPRS by at least 3 points in order to demonstrate clinically significant reduction in ankle/foot pain.    Baseline IE: 10; 4/8: 10; 5/18: 7/10; 6/22: 5/10    Time 8    Period Weeks    Status On-going    Target Date 08/11/19      PT LONG TERM GOAL #4   Title Patient will improve BERG by at least 3 points in order to demonstrate clinically significant improvement in balance.    Baseline IE: 25/56; 4/8: 25/56; 5/18: 38/56; 6/22: 47/56    Time 8    Period Weeks    Status Partially Met    Target Date 08/11/19      PT LONG TERM GOAL #5   Title Patient will decrease 5TSTS by at least 3 seconds without UE support nor compensatory patterns in order to demonstrate clinically significant improvement in LE strength.    Baseline IE: 29.8 s BUE support; 4/8: 29.8 s BUE support; 5/18: 25.5 s BUE on thighs; 6/22: 19.9 sec BUE on thighs    Time 8    Period Weeks    Status Partially Met    Target Date 08/11/19                 Plan - 07/23/19 1657    Clinical Impression Statement Patient presents to clinic with excellent motivation to participate in therapy. Patient demonstrates deficits in pain, posture, gait, strength, balance, and LLE ROM. Patient able to walk 850 feet with brief sitting rest break outside clinic with varied surfaces using RW during today's session and had a positive response to active interventions. Patient will benefit from continued skilled therapeutic intervention to address remaining deficits in pain, posture, gait, strength, balance, and LLE ROM in order to increase function and improve overall QOL.    Personal Factors and Comorbidities Age;Education;Sex;Comorbidity 3+;Fitness;Past/Current Experience;Social  Background;Finances;Transportation    Comorbidities ADHD, HTN, hyperlipidemia, migraines, adjustment disorder    Examination-Activity Limitations Bathing;Dressing;Transfers;Bed Mobility;Lift;Squat;Stairs;Locomotion Level;Stand    Examination-Participation Restrictions Dentist;Yard Work;Laundry;Cleaning;Community Activity;Shop;Meal Prep    Stability/Clinical Decision Making Evolving/Moderate complexity    Rehab Potential Good    PT Frequency 2x / week    PT Duration 8 weeks    PT Treatment/Interventions ADLs/Self Care Home Management;Cryotherapy;Electrical Stimulation;Moist Heat;Gait training;Stair training;Functional mobility training;Therapeutic activities;Neuromuscular re-education;Balance training;Therapeutic exercise;Patient/family education;Orthotic Fit/Training;Scar mobilization;Manual techniques;Dry needling;Splinting;Taping;Joint Manipulations;Spinal Manipulations;Passive range of motion    PT Next Visit Plan outside gait; step training    PT Home Exercise Plan  continue with prescribed HEP from [place] and Agree with Plan of Care Patient           Patient will benefit from skilled therapeutic intervention in order to improve the following deficits and impairments:  Abnormal gait, Decreased balance, Decreased endurance, Decreased mobility, Difficulty walking, Hypomobility, Improper body mechanics, Pain, Impaired flexibility, Increased fascial restricitons, Decreased strength, Decreased safety awareness, Decreased coordination, Decreased activity tolerance, Decreased range of motion, Decreased knowledge of precautions  Visit Diagnosis: Difficulty in walking, not elsewhere classified  Muscle weakness (generalized)  Pain in left lower leg     Problem List Patient Active Problem List   Diagnosis Date Noted   S/P laparoscopic hysterectomy 02/14/2017   Myles Gip PT, DPT [phone] 07/23/2019, 5:06 PM  [facility] [address] [place], [place], [address] Phone: [phone]   Fax:  [phone]  Name: Ashley Rose MRN: [id number] Date of Birth: 07-10-1975

## 2019-07-28 ENCOUNTER — Ambulatory Visit: Payer: Medicaid Other | Admitting: Physical Therapy

## 2019-07-30 ENCOUNTER — Ambulatory Visit: Payer: Medicaid Other | Admitting: Physical Therapy

## 2019-08-04 ENCOUNTER — Ambulatory Visit: Payer: Medicaid Other | Admitting: Physical Therapy

## 2019-08-06 ENCOUNTER — Ambulatory Visit: Payer: Medicaid Other | Admitting: Physical Therapy

## 2019-08-11 ENCOUNTER — Encounter: Payer: Medicaid Other | Admitting: Physical Therapy

## 2019-08-25 ENCOUNTER — Encounter: Payer: Medicaid Other | Admitting: Physical Therapy

## 2019-08-27 ENCOUNTER — Encounter: Payer: Medicaid Other | Admitting: Physical Therapy

## 2019-08-31 ENCOUNTER — Ambulatory Visit: Payer: Medicaid Other | Admitting: Physical Therapy

## 2019-09-01 ENCOUNTER — Other Ambulatory Visit: Payer: Self-pay | Admitting: Obstetrics and Gynecology

## 2019-09-01 ENCOUNTER — Other Ambulatory Visit: Payer: Self-pay | Admitting: Nurse Practitioner

## 2019-09-01 DIAGNOSIS — Z1231 Encounter for screening mammogram for malignant neoplasm of breast: Secondary | ICD-10-CM

## 2019-09-02 ENCOUNTER — Ambulatory Visit: Payer: Medicaid Other | Attending: Orthopedic Surgery | Admitting: Physical Therapy

## 2019-09-02 ENCOUNTER — Other Ambulatory Visit: Payer: Self-pay

## 2019-09-02 ENCOUNTER — Encounter: Payer: Self-pay | Admitting: Physical Therapy

## 2019-09-02 DIAGNOSIS — R262 Difficulty in walking, not elsewhere classified: Secondary | ICD-10-CM | POA: Diagnosis not present

## 2019-09-02 DIAGNOSIS — R2681 Unsteadiness on feet: Secondary | ICD-10-CM | POA: Diagnosis present

## 2019-09-02 DIAGNOSIS — M79662 Pain in left lower leg: Secondary | ICD-10-CM | POA: Diagnosis present

## 2019-09-02 DIAGNOSIS — M6281 Muscle weakness (generalized): Secondary | ICD-10-CM | POA: Insufficient documentation

## 2019-09-02 NOTE — Therapy (Signed)
[facility] [address]. [place], [place], [address] Phone: [phone]   Fax:  [phone]  Physical Therapy Evaluation  Patient Details  Name: Ashley Rose MRN: [id number] Date of Birth: 1976/01/15 Referring Provider (PT): Durenda Hurt   Encounter Date: 09/02/2019   PT End of Session - 09/02/19 1654    Visit Number 1    Number of Visits 16    Date for PT Re-Evaluation 10/28/19    PT Start Time 1000    PT Stop Time 1055    PT Time Calculation (min) 55 min    Equipment Utilized During Treatment Gait belt    Activity Tolerance Patient tolerated treatment well    Behavior During Therapy [facility] for tasks assessed/performed           Past Medical History:  Diagnosis Date  . ADHD (attention deficit hyperactivity disorder)   . DVT (deep venous thrombosis) (HCC)    Right leg  . Family history of adverse reaction to anesthesia    grandmother had a hard time waking up  . GERD (gastroesophageal reflux disease)   . Headache    migraines  . History of kidney stones   . Hypercholesteremia   . Hypertension   . Seizures ([place])    last seizure at age 49    Past Surgical History:  Procedure Laterality Date  . CHOLECYSTECTOMY  2004  . COLPOSCOPY  01/2014  . COLPOSCOPY  03/2014  . CYSTOSCOPY N/A 02/14/2017   Procedure: CYSTOSCOPY;  Surgeon: Malachy Mood, MD;  Location: [facility] ORS;  Service: Gynecology;  Laterality: N/A;  . DILATION AND CURETTAGE OF UTERUS  2015  . LAPAROSCOPIC HYSTERECTOMY Bilateral 02/14/2017   Procedure: HYSTERECTOMY TOTAL LAPAROSCOPIC BILATERAL SALPINGECTOMY;  Surgeon: Malachy Mood, MD;  Location: [facility] ORS;  Service: Gynecology;  Laterality: Bilateral;  . LEG SURGERY Bilateral    as a child to correct walking   . TONSILLECTOMY      There were no vitals filed for this visit.        [facility] PT Assessment - 09/02/19 0001      Assessment   Medical Diagnosis Displaced bicondylar fracture of  left tibia, subsequent encounter for closed fracture with routine healing    Referring Provider (PT) Durenda Hurt    Onset Date/Surgical Date 12/02/18    Hand Dominance Right    Prior Therapy Yes      Precautions   Precautions Fall      Standardized Balance Assessment   Standardized Balance Assessment Dynamic Gait Index      Dynamic Gait Index   Level Surface Mild Impairment    Change in Gait Speed Mild Impairment    Gait with Horizontal Head Turns Moderate Impairment    Gait with Vertical Head Turns Mild Impairment    Gait and Pivot Turn Mild Impairment    Step Over Obstacle Moderate Impairment    Step Around Obstacles Moderate Impairment    Steps Mild Impairment    Total Score 13            SUBJECTIVE Chief complaint: Patient well-known to clinic; presenting for physical therapy related to gait and balance s/p traumatic fractures of LLE. Patient presents with quad cane today and is able to stand unsupported for check-in at window. Patient notes continued back pain with increased activity but attributes this to her weight gain since the accident and her decreased activity level. Patient notes that she has been practicing walking in her apartment  complex to maintain progress from initial bout of therapy and is eager to continue with PT to return to her PLOF including her ability to walk to community stores within 2-3 blocks of her apartment for basic shopping needs.  Red flags (bowel/bladder changes, saddle paresthesia, personal history of cancer, chills/fever, night sweats, unrelenting pain) Negative  OBJECTIVE MUSCULOSKELETAL: Tremor: Absent Bulk: Normal Tone: Normal, no clonus  Posture Rounded shoulders and increased thoracic flexion at rest. Increased lumbar lordosis in standing.   Gait Step-through gait with quad cane, 2-3 point; minimal L foot clearance and L knee flexion during swing phase. Stance on L slightly < than R.  NEUROLOGICAL: Mental Status Patient is  oriented to person, place and time.  Recent memory is intact.  Remote memory is intact.  Attention span and concentration are intact.  Expressive speech is intact.  Patient's fund of knowledge is within normal limits for educational level.  Cranial Nerves Visual acuity and visual fields are intact  Extraocular muscles are intact  Facial sensation is intact bilaterally  Facial strength is intact bilaterally  Hearing is normal as tested by gross conversation Palate elevates midline, normal phonation  Shoulder shrug strength is intact  Tongue protrudes midline   Sensation Grossly intact to light touch bilateral UEs/LEs as determined by testing dermatomes C2-T2/L2-S2 respectively Proprioception and hot/cold testing deferred on this date  Reflexes R/L 2+/2+ Knee Jerk (L3/4) 2+/2+ Ankle Jerk (S1/2)  Coordination/Cerebellar Finger to Nose: WNL Heel to Shin: WNL Rapid alternating movements: WNL Finger Opposition: WNL Pronator Drift: Negative  FUNCTIONAL OUTCOME MEASURES   Results Comments  DGI 13/24 Fall risk  5TSTS 24.6 seconds No UE support; falls risk, indicative of strength deficits  6 Minute Walk Test 272 feet, quad cane, CGA/SBA Not safe for community ambulation    ASSESSMENT Patient is a 44 year old presenting to clinic with chief complaints of LLE weakness, unsteadiness during gait, and decreased endurance for community ambulation. Upon examination, patient demonstrates deficits in balance, BLE strength, gait as evidenced by DGI 13/24, 5TSTS 24.6 seconds, 6MWT 272 feet with quad cane, CGA/SBA. Patient's responses on FOTO outcome measures (43) indicate significant functional limitations/disability/distress. Patient's progress may be limited due to socioeconomic barriers to health care; however, patient's motivation and previous progress with physical therapy are advantageous. Patient was able to achieve stair ascent/descent with B railing alternating feet, SBA without LOB and  with good postural control during today's evaluation and responded positively to active interventions. Patient will benefit from continued skilled therapeutic intervention to address deficits in balance, BLE strength, gait in order to increase function and improve overall QOL.    Objective measurements completed on examination: See above findings.                    PT Long Term Goals - 09/02/19 1655      PT LONG TERM GOAL #1   Title Patient will improve DGI by at least 3 points in order to demonstrate clinically significant improvement in balance and decreased risk for falls.    Baseline 8/4: 13/24    Time 8    Period Weeks    Status New    Target Date 10/28/19      PT LONG TERM GOAL #2   Title Patient will demonstrate improved function as evidenced by a score of 64 on FOTO measure for full participation in activities at home and in the community.    Baseline 8/4: 43    Time 8  Period Weeks    Status New    Target Date 10/28/19      PT LONG TERM GOAL #3   Title Patient will increase 6MWT by at least 19m (186ft) in order to demonstrate clinically significant improvement in cardiopulmonary endurance and community ambulation.    Baseline 8/4: 272 feet, quad cane, CGA/SBA    Time 8    Period Weeks    Status New    Target Date 10/28/19      PT LONG TERM GOAL #4   Title Patient will be able to walk 0.3 mi/ 6 blocks with LRAD and carrying a load of 6# independently without LOB or unsteadiness in order to return to PLOF.    Baseline IE: 272 feet, quad cane, no external load    Time 8    Period Weeks    Status New    Target Date 10/28/19      PT LONG TERM GOAL #5   Title Patient will decrease 5TSTS by at least 3 seconds without UE support nor compensatory patterns in order to demonstrate clinically significant improvement in LE strength.    Baseline IE: 24.6 sec, no UE support    Time 8    Period Weeks    Status New    Target Date 10/28/19                   Plan - 09/02/19 1654    Clinical Impression Statement Patient is a 44 year old presenting to clinic with chief complaints of LLE weakness, unsteadiness during gait, and decreased endurance for community ambulation. Upon examination, patient demonstrates deficits in balance, BLE strength, gait as evidenced by DGI 13/24, 5TSTS 24.6 seconds, 6MWT 272 feet with quad cane, CGA/SBA. Patient's responses on FOTO outcome measures (43) indicate significant functional limitations/disability/distress. Patient's progress may be limited due to socioeconomic barriers to health care; however, patient's motivation and previous progress with physical therapy are advantageous. Patient was able to achieve stair ascent/descent with B railing alternating feet, SBA without LOB and with good postural control during today's evaluation and responded positively to active interventions. Patient will benefit from continued skilled therapeutic intervention to address deficits in balance, BLE strength, gait in order to increase function and improve overall QOL.    Personal Factors and Comorbidities Age;Education;Sex;Comorbidity 3+;Fitness;Past/Current Experience;Social Background;Finances;Transportation    Comorbidities ADHD, HTN, hyperlipidemia, migraines, adjustment disorder    Examination-Activity Limitations Bathing;Dressing;Transfers;Bed Mobility;Lift;Squat;Stairs;Locomotion Level;Stand    Examination-Participation Restrictions Dentist;Yard Work;Laundry;Cleaning;Community Activity;Shop;Meal Prep    Stability/Clinical Decision Making Evolving/Moderate complexity    Clinical Decision Making Moderate    Rehab Potential Good    PT Frequency 2x / week    PT Duration 8 weeks    PT Treatment/Interventions ADLs/Self Care Home Management;Cryotherapy;Electrical Stimulation;Moist Heat;Gait training;Stair training;Functional mobility training;Therapeutic activities;Neuromuscular re-education;Balance  training;Therapeutic exercise;Patient/family education;Orthotic Fit/Training;Scar mobilization;Manual techniques;Dry needling;Splinting;Taping;Joint Manipulations;Spinal Manipulations;Passive range of motion    PT Next Visit Plan BLE strengthening; dynamic balance    PT Home Exercise Plan Continue with gait in home/apartment complex    Consulted and Agree with Plan of Care Patient           Patient will benefit from skilled therapeutic intervention in order to improve the following deficits and impairments:  Abnormal gait, Decreased balance, Decreased endurance, Decreased mobility, Difficulty walking, Hypomobility, Improper body mechanics, Pain, Impaired flexibility, Increased fascial restricitons, Decreased strength, Decreased safety awareness, Decreased coordination, Decreased activity tolerance, Decreased range of motion, Decreased knowledge of precautions  Visit Diagnosis: Difficulty in walking, not  elsewhere classified  Muscle weakness (generalized)  Unsteadiness on feet     Problem List Patient Active Problem List   Diagnosis Date Noted  . S/P laparoscopic hysterectomy 02/14/2017   Myles Gip PT, DPT [phone] 09/02/2019, 5:17 PM  [facility] [address] [place], [place], [address] Phone: [phone]   Fax:  [phone]  Name: Ashley Rose MRN: [id number] Date of Birth: [birth year]

## 2019-09-07 ENCOUNTER — Other Ambulatory Visit: Payer: Self-pay

## 2019-09-07 ENCOUNTER — Ambulatory Visit: Payer: Medicaid Other | Admitting: Physical Therapy

## 2019-09-07 ENCOUNTER — Encounter: Payer: Self-pay | Admitting: Physical Therapy

## 2019-09-07 DIAGNOSIS — R262 Difficulty in walking, not elsewhere classified: Secondary | ICD-10-CM | POA: Diagnosis not present

## 2019-09-07 DIAGNOSIS — R2681 Unsteadiness on feet: Secondary | ICD-10-CM

## 2019-09-07 DIAGNOSIS — M6281 Muscle weakness (generalized): Secondary | ICD-10-CM

## 2019-09-07 NOTE — Therapy (Signed)
[facility] [address] [place], [place], [address] Phone: [phone]   Fax:  [phone]  Physical Therapy Treatment  Patient Details  Name: Ashley Rose MRN: [id number] Date of Birth: [birth year] Referring Provider (PT): Durenda Hurt   Encounter Date: 09/07/2019   PT End of Session - 09/07/19 1239    Visit Number 2    Number of Visits 16    Date for PT Re-Evaluation 10/28/19    Authorization Type [employer]    PT Start Time 1250    PT Stop Time 6962    PT Time Calculation (min) 55 min    Equipment Utilized During Treatment Gait belt    Activity Tolerance Patient tolerated treatment well    Behavior During Therapy WFL for tasks assessed/performed           Past Medical History:  Diagnosis Date   ADHD (attention deficit hyperactivity disorder)    DVT (deep venous thrombosis) (HCC)    Right leg   Family history of adverse reaction to anesthesia    grandmother had a hard time waking up   GERD (gastroesophageal reflux disease)    Headache    migraines   History of kidney stones    Hypercholesteremia    Hypertension    Seizures ([place])    last seizure at age 55    Past Surgical History:  Procedure Laterality Date   CHOLECYSTECTOMY  2004   COLPOSCOPY  01/2014   COLPOSCOPY  03/2014   CYSTOSCOPY N/A 02/14/2017   Procedure: CYSTOSCOPY;  Surgeon: Malachy Mood, MD;  Location: [facility] ORS;  Service: Gynecology;  Laterality: N/A;   DILATION AND CURETTAGE OF UTERUS  2015   LAPAROSCOPIC HYSTERECTOMY Bilateral 02/14/2017   Procedure: HYSTERECTOMY TOTAL LAPAROSCOPIC BILATERAL SALPINGECTOMY;  Surgeon: Malachy Mood, MD;  Location: [facility] ORS;  Service: Gynecology;  Laterality: Bilateral;   LEG SURGERY Bilateral    as a child to correct walking    TONSILLECTOMY      There were no vitals filed for this visit.   Subjective Assessment - 09/07/19 1255    Subjective Patient states that she woke up  stiff this morning from sleeping with her knees bent as she would've prior to the accident. Patient notes she was able to achieve the position but had increased pain/stiffness as a result. Patient also notes that she has been started on metformin since her last session to manage new onset of DM; she feels this is making her craving sugar.    Currently in Pain? Yes    Pain Score 5     Pain Location Leg    Pain Orientation Left    Pain Descriptors / Indicators Tightness           TREATMENT  Neuromuscular Re-education: Gait in hallway with quad cane and 4# backpack load, 4x 4 min bouts with 2 min rest breaks. VCs for LLE knee flexion and pace. SBA/MOD IND BLE dorsiflexion mobilization at step with alternate LE gastroc stretch, x30 each  Therapeutic Exercise: NuStep, seat 9, L7-8, spm 50-60 x10 min for improved tolerance to WB and activity, VCs for increased LLE activation RLE 6" step up x10, BUE, SBA LLE 6" step up x3, BUE, MOD A/CGA   Patient educated throughout session on appropriate technique and form using multi-modal cueing, HEP, and activity modification. Patient articulated understanding and returned demonstration.  Patient Response to interventions: Notes fatigue in LLE.  ASSESSMENT Patient presents to clinic with excellent motivation to  participate in therapy. Patient demonstrates deficits in balance, BLE strength, gait. Patient able to achieve gait with loaded backpack using quad cane for 16 min total during today's session and responded positively to active interventions. Patient will benefit from continued skilled therapeutic intervention to address remaining deficits in balance, BLE strength, gait in order to increase function and improve overall QOL.      PT Long Term Goals - 09/02/19 1655      PT LONG TERM GOAL #1   Title Patient will improve DGI by at least 3 points in order to demonstrate clinically significant improvement in balance and decreased risk for falls.     Baseline 8/4: 13/24    Time 8    Period Weeks    Status New    Target Date 10/28/19      PT LONG TERM GOAL #2   Title Patient will demonstrate improved function as evidenced by a score of 64 on FOTO measure for full participation in activities at home and in the community.    Baseline 8/4: 43    Time 8    Period Weeks    Status New    Target Date 10/28/19      PT LONG TERM GOAL #3   Title Patient will increase 6MWT by at least 5m (132ft) in order to demonstrate clinically significant improvement in cardiopulmonary endurance and community ambulation.    Baseline 8/4: 272 feet, quad cane, CGA/SBA    Time 8    Period Weeks    Status New    Target Date 10/28/19      PT LONG TERM GOAL #4   Title Patient will be able to walk 0.3 mi/ 6 blocks with LRAD and carrying a load of 6# independently without LOB or unsteadiness in order to return to PLOF.    Baseline IE: 272 feet, quad cane, no external load    Time 8    Period Weeks    Status New    Target Date 10/28/19      PT LONG TERM GOAL #5   Title Patient will decrease 5TSTS by at least 3 seconds without UE support nor compensatory patterns in order to demonstrate clinically significant improvement in LE strength.    Baseline IE: 24.6 sec, no UE support    Time 8    Period Weeks    Status New    Target Date 10/28/19                 Plan - 09/07/19 1241    Clinical Impression Statement Patient presents to clinic with excellent motivation to participate in therapy. Patient demonstrates deficits in balance, BLE strength, gait. Patient able to achieve gait with loaded backpack using quad cane for 16 min total during today's session and responded positively to active interventions. Patient will benefit from continued skilled therapeutic intervention to address remaining deficits in balance, BLE strength, gait in order to increase function and improve overall QOL.    Personal Factors and Comorbidities  Age;Education;Sex;Comorbidity 3+;Fitness;Past/Current Experience;Social Background;Finances;Transportation    Comorbidities ADHD, HTN, hyperlipidemia, migraines, adjustment disorder    Examination-Activity Limitations Bathing;Dressing;Transfers;Bed Mobility;Lift;Squat;Stairs;Locomotion Level;Stand    Examination-Participation Restrictions Dentist;Yard Work;Laundry;Cleaning;Community Activity;Shop;Meal Prep    Stability/Clinical Decision Making Evolving/Moderate complexity    Rehab Potential Good    PT Frequency 2x / week    PT Duration 8 weeks    PT Treatment/Interventions ADLs/Self Care Home Management;Cryotherapy;Electrical Stimulation;Moist Heat;Gait training;Stair training;Functional mobility training;Therapeutic activities;Neuromuscular re-education;Balance training;Therapeutic exercise;Patient/family education;Orthotic Fit/Training;Scar mobilization;Manual techniques;Dry  needling;Splinting;Taping;Joint Manipulations;Spinal Manipulations;Passive range of motion    PT Next Visit Plan BLE strengthening; dynamic balance    PT Home Exercise Plan Continue with gait in home/apartment complex    Consulted and Agree with Plan of Care Patient           Patient will benefit from skilled therapeutic intervention in order to improve the following deficits and impairments:  Abnormal gait, Decreased balance, Decreased endurance, Decreased mobility, Difficulty walking, Hypomobility, Improper body mechanics, Pain, Impaired flexibility, Increased fascial restricitons, Decreased strength, Decreased safety awareness, Decreased coordination, Decreased activity tolerance, Decreased range of motion, Decreased knowledge of precautions  Visit Diagnosis: Difficulty in walking, not elsewhere classified  Muscle weakness (generalized)  Unsteadiness on feet     Problem List Patient Active Problem List   Diagnosis Date Noted   S/P laparoscopic hysterectomy 02/14/2017   Myles Gip PT, DPT  [phone] 09/07/2019, 3:28 PM  [facility] [address] [place], [place], [address] Phone: [phone]   Fax:  [phone]  Name: Ashley Rose MRN: [id number] Date of Birth: Feb 01, 1975

## 2019-09-21 ENCOUNTER — Ambulatory Visit: Payer: Medicaid Other | Admitting: Physical Therapy

## 2019-09-24 ENCOUNTER — Encounter: Payer: Self-pay | Admitting: Physical Therapy

## 2019-09-24 ENCOUNTER — Other Ambulatory Visit: Payer: Self-pay

## 2019-09-24 ENCOUNTER — Ambulatory Visit: Payer: Medicaid Other | Admitting: Physical Therapy

## 2019-09-24 DIAGNOSIS — R262 Difficulty in walking, not elsewhere classified: Secondary | ICD-10-CM

## 2019-09-24 DIAGNOSIS — M79662 Pain in left lower leg: Secondary | ICD-10-CM

## 2019-09-24 DIAGNOSIS — M6281 Muscle weakness (generalized): Secondary | ICD-10-CM

## 2019-09-24 DIAGNOSIS — R2681 Unsteadiness on feet: Secondary | ICD-10-CM

## 2019-09-24 NOTE — Therapy (Signed)
[facility] [address]. [place], [place], [address] Phone: [phone]   Fax:  [phone]  Physical Therapy Treatment  Patient Details  Name: Ashley Rose MRN: [id number] Date of Birth: [birth year] Referring Provider (PT): Durenda Hurt   Encounter Date: 09/24/2019   PT End of Session - 09/24/19 1435    Visit Number 3    Number of Visits 16    Date for PT Re-Evaluation 10/28/19    Authorization Type [employer]    PT Start Time 1330    PT Stop Time 1425    PT Time Calculation (min) 55 min    Equipment Utilized During Treatment Gait belt    Activity Tolerance Patient tolerated treatment well    Behavior During Therapy [facility] for tasks assessed/performed           Past Medical History:  Diagnosis Date  . ADHD (attention deficit hyperactivity disorder)   . DVT (deep venous thrombosis) (HCC)    Right leg  . Family history of adverse reaction to anesthesia    grandmother had a hard time waking up  . GERD (gastroesophageal reflux disease)   . Headache    migraines  . History of kidney stones   . Hypercholesteremia   . Hypertension   . Seizures ([place])    last seizure at age 68    Past Surgical History:  Procedure Laterality Date  . CHOLECYSTECTOMY  2004  . COLPOSCOPY  01/2014  . COLPOSCOPY  03/2014  . CYSTOSCOPY N/A 02/14/2017   Procedure: CYSTOSCOPY;  Surgeon: Malachy Mood, MD;  Location: [facility] ORS;  Service: Gynecology;  Laterality: N/A;  . DILATION AND CURETTAGE OF UTERUS  2015  . LAPAROSCOPIC HYSTERECTOMY Bilateral 02/14/2017   Procedure: HYSTERECTOMY TOTAL LAPAROSCOPIC BILATERAL SALPINGECTOMY;  Surgeon: Malachy Mood, MD;  Location: [facility] ORS;  Service: Gynecology;  Laterality: Bilateral;  . LEG SURGERY Bilateral    as a child to correct walking   . TONSILLECTOMY      There were no vitals filed for this visit.   Subjective Assessment - 09/24/19 1433    Subjective Patient notes that she missed  last session due to a death in the family. Patient reports that she has been walking short distances in the home without an AD with no issue. She continues to practice STS with BUE on thighs.    Currently in Pain? Yes    Pain Score 4     Pain Location Leg    Pain Orientation Left           TREATMENT  Neuromuscular Re-education: RLE 6" step up, faded UE reliance, 2x5, SBA LLE 6" step up, faded UE reliance, 2x5, SBA/CGA //bars: fwd/retro ambulation, no UE support, SBA/CGA, x3 laps  Lateral walking, no UE support, SBA, x3 laps  Negotiating 2" obstacles, faded UE support, SBA, x3 laps   Therapeutic Exercise: NuStep, seat 9, L3-7, spm 50-60; variable hill workout,  x10 min for improved tolerance to WB and activity, VCs for increased LLE activation RLE 6" step tap x20, BUE, SBA LLE 6" step tap x20, BUE, CGA   Patient educated throughout session on appropriate technique and form using multi-modal cueing, HEP, and activity modification. Patient articulated understanding and returned demonstration.  Patient Response to interventions: Denies any increased pain/concern.  ASSESSMENT Patient presents to clinic with excellent motivation to participate in therapy. Patient demonstrates deficits in balance, BLE strength, gait. Patient able to perform increased LLE step ups and ambulate with good  postural control without AD (SBA/CGA) during today's session and responded positively to active interventions. Patient will benefit from continued skilled therapeutic intervention to address remaining deficits in balance, BLE strength, gait in order to increase function and improve overall QOL.      PT Long Term Goals - 09/02/19 1655      PT LONG TERM GOAL #1   Title Patient will improve DGI by at least 3 points in order to demonstrate clinically significant improvement in balance and decreased risk for falls.    Baseline 8/4: 13/24    Time 8    Period Weeks    Status New    Target Date 10/28/19       PT LONG TERM GOAL #2   Title Patient will demonstrate improved function as evidenced by a score of 64 on FOTO measure for full participation in activities at home and in the community.    Baseline 8/4: 43    Time 8    Period Weeks    Status New    Target Date 10/28/19      PT LONG TERM GOAL #3   Title Patient will increase 6MWT by at least 86m (144ft) in order to demonstrate clinically significant improvement in cardiopulmonary endurance and community ambulation.    Baseline 8/4: 272 feet, quad cane, CGA/SBA    Time 8    Period Weeks    Status New    Target Date 10/28/19      PT LONG TERM GOAL #4   Title Patient will be able to walk 0.3 mi/ 6 blocks with LRAD and carrying a load of 6# independently without LOB or unsteadiness in order to return to PLOF.    Baseline IE: 272 feet, quad cane, no external load    Time 8    Period Weeks    Status New    Target Date 10/28/19      PT LONG TERM GOAL #5   Title Patient will decrease 5TSTS by at least 3 seconds without UE support nor compensatory patterns in order to demonstrate clinically significant improvement in LE strength.    Baseline IE: 24.6 sec, no UE support    Time 8    Period Weeks    Status New    Target Date 10/28/19                 Plan - 09/24/19 1436    Clinical Impression Statement Patient presents to clinic with excellent motivation to participate in therapy. Patient demonstrates deficits in balance, BLE strength, gait. Patient able to perform increased LLE step ups and ambulate with good postural control without AD (SBA/CGA) during today's session and responded positively to active interventions. Patient will benefit from continued skilled therapeutic intervention to address remaining deficits in balance, BLE strength, gait in order to increase function and improve overall QOL.    Personal Factors and Comorbidities Age;Education;Sex;Comorbidity 3+;Fitness;Past/Current Experience;Social  Background;Finances;Transportation    Comorbidities ADHD, HTN, hyperlipidemia, migraines, adjustment disorder    Examination-Activity Limitations Bathing;Dressing;Transfers;Bed Mobility;Lift;Squat;Stairs;Locomotion Level;Stand    Examination-Participation Restrictions Dentist;Yard Work;Laundry;Cleaning;Community Activity;Shop;Meal Prep    Stability/Clinical Decision Making Evolving/Moderate complexity    Rehab Potential Good    PT Frequency 2x / week    PT Duration 8 weeks    PT Treatment/Interventions ADLs/Self Care Home Management;Cryotherapy;Electrical Stimulation;Moist Heat;Gait training;Stair training;Functional mobility training;Therapeutic activities;Neuromuscular re-education;Balance training;Therapeutic exercise;Patient/family education;Orthotic Fit/Training;Scar mobilization;Manual techniques;Dry needling;Splinting;Taping;Joint Manipulations;Spinal Manipulations;Passive range of motion    PT Next Visit Plan BLE strengthening; dynamic balance  PT Home Exercise Plan Continue with gait in home/apartment complex    Consulted and Agree with Plan of Care Patient           Patient will benefit from skilled therapeutic intervention in order to improve the following deficits and impairments:  Abnormal gait, Decreased balance, Decreased endurance, Decreased mobility, Difficulty walking, Hypomobility, Improper body mechanics, Pain, Impaired flexibility, Increased fascial restricitons, Decreased strength, Decreased safety awareness, Decreased coordination, Decreased activity tolerance, Decreased range of motion, Decreased knowledge of precautions  Visit Diagnosis: Difficulty in walking, not elsewhere classified  Muscle weakness (generalized)  Unsteadiness on feet  Pain in left lower leg     Problem List Patient Active Problem List   Diagnosis Date Noted  . S/P laparoscopic hysterectomy 02/14/2017   Myles Gip PT, DPT [phone]  09/24/2019, 2:47 PM  [facility] [address] [place], [place], [address] Phone: [phone]   Fax:  [phone]  Name: Ashley Rose MRN: [id number] Date of Birth: 1976/01/04

## 2019-09-28 ENCOUNTER — Ambulatory Visit: Payer: Medicaid Other | Admitting: Physical Therapy

## 2019-09-28 ENCOUNTER — Other Ambulatory Visit: Payer: Self-pay

## 2019-09-28 ENCOUNTER — Encounter: Payer: Self-pay | Admitting: Physical Therapy

## 2019-09-28 DIAGNOSIS — R262 Difficulty in walking, not elsewhere classified: Secondary | ICD-10-CM | POA: Diagnosis not present

## 2019-09-28 DIAGNOSIS — R2681 Unsteadiness on feet: Secondary | ICD-10-CM

## 2019-09-28 DIAGNOSIS — M79662 Pain in left lower leg: Secondary | ICD-10-CM

## 2019-09-28 DIAGNOSIS — M6281 Muscle weakness (generalized): Secondary | ICD-10-CM

## 2019-09-28 NOTE — Therapy (Signed)
[facility] [address] [place], [place], [address] Phone: [phone]   Fax:  [phone]  Physical Therapy Treatment  Patient Details  Name: Ashley Rose MRN: [id number] Date of Birth: 1975-08-04 Referring Provider (PT): Durenda Hurt   Encounter Date: 09/28/2019   PT End of Session - 09/28/19 1345    Visit Number 4    Number of Visits 16    Date for PT Re-Evaluation 10/28/19    Authorization Type [employer]    Authorization - Visit Number 3    Authorization - Number of Visits 16    PT Start Time 1240    PT Stop Time 1340    PT Time Calculation (min) 60 min    Equipment Utilized During Treatment Gait belt    Activity Tolerance Patient tolerated treatment well    Behavior During Therapy WFL for tasks assessed/performed           Past Medical History:  Diagnosis Date  . ADHD (attention deficit hyperactivity disorder)   . DVT (deep venous thrombosis) (HCC)    Right leg  . Family history of adverse reaction to anesthesia    grandmother had a hard time waking up  . GERD (gastroesophageal reflux disease)   . Headache    migraines  . History of kidney stones   . Hypercholesteremia   . Hypertension   . Seizures ([place])    last seizure at age 44    Past Surgical History:  Procedure Laterality Date  . CHOLECYSTECTOMY  2004  . COLPOSCOPY  01/2014  . COLPOSCOPY  03/2014  . CYSTOSCOPY N/A 02/14/2017   Procedure: CYSTOSCOPY;  Surgeon: Malachy Mood, MD;  Location: [facility];  Service: Gynecology;  Laterality: N/A;  . DILATION AND CURETTAGE OF UTERUS  2015  . LAPAROSCOPIC HYSTERECTOMY Bilateral 02/14/2017   Procedure: HYSTERECTOMY TOTAL LAPAROSCOPIC BILATERAL SALPINGECTOMY;  Surgeon: Malachy Mood, MD;  Location: [facility] OR[facility];  Service: Gynecology;  Laterality: Bilateral;  . LEG SURGERY Bilateral    as a child to correct walking   . TONSILLECTOMY      There were no vitals filed for this visit.    Subjective Assessment - 09/28/19 1344    Subjective Patient states that she had a pretty good weekend but she did overdo it on Friday and was feeling sore on Saturday. Patient adds that she continues to work on walking with AD and short distances without and has had no falls.    Currently in Pain? Yes    Pain Score 3     Pain Location Knee    Pain Orientation Left           TREATMENT  Neuromuscular Re-education: //bars: Airex beam lateral walking, faded UE support, CBA, x5 laps  Negotiating 2-4" obstacles with variable surface complaince, faded UE support, CGA, x5 laps  Hurdle walking (6") for improved hip flexion and stride length, x4 laps, BUE support, SBA, x4 laps Gait in hallway with and without AD, (80 feet with QC/SPC, 40 feet without), SBA/CGA, x2 bouts   Therapeutic Exercise: NuStep, seat 9, L3-10, spm 50-60; variable hill workout,  x10 min for improved tolerance to WB and activity, VCs for increased LLE activation 5# CW BLE strengthening:  Hip flexion march *seated, x15 each  LAQ, x15 each  Hamstring curl standing, x15 each  Hip abduction, x15 each    Patient educated throughout session on appropriate technique and form using multi-modal cueing, HEP, and activity modification. Patient articulated understanding  and returned demonstration.  Patient Response to interventions: Does not report any increased pain/concern.  ASSESSMENT Patient presents to clinic with excellent motivation to participate in therapy. Patient demonstrates deficits in balance, BLE strength, gait. Patient's gait with more symmetrical stride length and WB with SPC during today's session and responded positively to active interventions. Patient will benefit from continued skilled therapeutic intervention to address remaining deficits in balance, BLE strength, gait in order to increase function and improve overall QOL.      PT Long Term Goals - 09/02/19 1655      PT LONG TERM GOAL #1   Title Patient  will improve DGI by at least 3 points in order to demonstrate clinically significant improvement in balance and decreased risk for falls.    Baseline 8/4: 13/24    Time 8    Period Weeks    Status New    Target Date 10/28/19      PT LONG TERM GOAL #2   Title Patient will demonstrate improved function as evidenced by a score of 64 on FOTO measure for full participation in activities at home and in the community.    Baseline 8/4: 43    Time 8    Period Weeks    Status New    Target Date 10/28/19      PT LONG TERM GOAL #3   Title Patient will increase 6MWT by at least 59m (159ft) in order to demonstrate clinically significant improvement in cardiopulmonary endurance and community ambulation.    Baseline 8/4: 272 feet, quad cane, CGA/SBA    Time 8    Period Weeks    Status New    Target Date 10/28/19      PT LONG TERM GOAL #4   Title Patient will be able to walk 0.3 mi/ 6 blocks with LRAD and carrying a load of 6# independently without LOB or unsteadiness in order to return to PLOF.    Baseline IE: 272 feet, quad cane, no external load    Time 8    Period Weeks    Status New    Target Date 10/28/19      PT LONG TERM GOAL #5   Title Patient will decrease 5TSTS by at least 3 seconds without UE support nor compensatory patterns in order to demonstrate clinically significant improvement in LE strength.    Baseline IE: 24.6 sec, no UE support    Time 8    Period Weeks    Status New    Target Date 10/28/19                 Plan - 09/28/19 1346    Clinical Impression Statement Patient presents to clinic with excellent motivation to participate in therapy. Patient demonstrates deficits in balance, BLE strength, gait. Patient's gait with more symmetrical stride length and WB with SPC during today's session and responded positively to active interventions. Patient will benefit from continued skilled therapeutic intervention to address remaining deficits in balance, BLE strength,  gait in order to increase function and improve overall QOL.    Personal Factors and Comorbidities Age;Education;Sex;Comorbidity 3+;Fitness;Past/Current Experience;Social Background;Finances;Transportation    Comorbidities ADHD, HTN, hyperlipidemia, migraines, adjustment disorder    Examination-Activity Limitations Bathing;Dressing;Transfers;Bed Mobility;Lift;Squat;Stairs;Locomotion Level;Stand    Examination-Participation Restrictions Dentist;Yard Work;Laundry;Cleaning;Community Activity;Shop;Meal Prep    Stability/Clinical Decision Making Evolving/Moderate complexity    Rehab Potential Good    PT Frequency 2x / week    PT Duration 8 weeks    PT Treatment/Interventions ADLs/Self Care  Home Management;Cryotherapy;Electrical Stimulation;Moist Heat;Gait training;Stair training;Functional mobility training;Therapeutic activities;Neuromuscular re-education;Balance training;Therapeutic exercise;Patient/family education;Orthotic Fit/Training;Scar mobilization;Manual techniques;Dry needling;Splinting;Taping;Joint Manipulations;Spinal Manipulations;Passive range of motion    PT Next Visit Plan BLE strengthening; dynamic balance    PT Home Exercise Plan Continue with gait in home/apartment complex    Consulted and Agree with Plan of Care Patient           Patient will benefit from skilled therapeutic intervention in order to improve the following deficits and impairments:  Abnormal gait, Decreased balance, Decreased endurance, Decreased mobility, Difficulty walking, Hypomobility, Improper body mechanics, Pain, Impaired flexibility, Increased fascial restricitons, Decreased strength, Decreased safety awareness, Decreased coordination, Decreased activity tolerance, Decreased range of motion, Decreased knowledge of precautions  Visit Diagnosis: Difficulty in walking, not elsewhere classified  Muscle weakness (generalized)  Unsteadiness on feet  Pain in left lower leg     Problem  List Patient Active Problem List   Diagnosis Date Noted  . S/P laparoscopic hysterectomy 02/14/2017   Myles Gip PT, DPT [phone]  09/28/2019, 1:58 PM  [facility] [address] [place], [place], [address] Phone: [phone]   Fax:  [phone]  Name: NAJIA HURLBUTT MRN: [id number] Date of Birth: [birth year]

## 2019-09-30 ENCOUNTER — Ambulatory Visit
Admission: RE | Admit: 2019-09-30 | Discharge: 2019-09-30 | Disposition: A | Discharge status: Home / Self Care | Payer: Medicaid Other | Source: Ambulatory Visit | Attending: Nurse Practitioner | Admitting: Nurse Practitioner

## 2019-09-30 ENCOUNTER — Other Ambulatory Visit: Payer: Self-pay

## 2019-09-30 DIAGNOSIS — Z1231 Encounter for screening mammogram for malignant neoplasm of breast: Secondary | ICD-10-CM | POA: Insufficient documentation

## 2019-09-30 HISTORY — DX: Family history of malignant neoplasm of ovary: Z80.41

## 2019-10-01 ENCOUNTER — Encounter: Payer: Self-pay | Admitting: Physical Therapy

## 2019-10-01 ENCOUNTER — Ambulatory Visit: Payer: Medicaid Other | Attending: Orthopedic Surgery | Admitting: Physical Therapy

## 2019-10-01 DIAGNOSIS — R262 Difficulty in walking, not elsewhere classified: Secondary | ICD-10-CM | POA: Diagnosis present

## 2019-10-01 DIAGNOSIS — M6281 Muscle weakness (generalized): Secondary | ICD-10-CM | POA: Diagnosis present

## 2019-10-01 DIAGNOSIS — R2681 Unsteadiness on feet: Secondary | ICD-10-CM | POA: Diagnosis present

## 2019-10-01 NOTE — Therapy (Signed)
[facility] [address] [place], [place], [address] Phone: [phone]   Fax:  [phone]  Physical Therapy Treatment  Patient Details  Name: Ashley Rose MRN: [id number] Date of Birth: 04/05/1975 Referring Provider (PT): Durenda Hurt   Encounter Date: 10/01/2019   PT End of Session - 10/01/19 1825    Visit Number 5    Number of Visits 16    Date for PT Re-Evaluation 10/28/19    Authorization Type [employer]    Authorization - Visit Number 4    Authorization - Number of Visits 16    PT Start Time 1430    PT Stop Time 1500    PT Time Calculation (min) 30 min    Equipment Utilized During Treatment Gait belt    Activity Tolerance Patient tolerated treatment well    Behavior During Therapy [facility] for tasks assessed/performed           Past Medical History:  Diagnosis Date  . ADHD (attention deficit hyperactivity disorder)   . DVT (deep venous thrombosis) (HCC)    Right leg  . Family history of adverse reaction to anesthesia    grandmother had a hard time waking up  . Family history of ovarian cancer   . GERD (gastroesophageal reflux disease)   . Headache    migraines  . History of kidney stones   . Hypercholesteremia   . Hypertension   . 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 CHOLECYSTECTOMY  2004  . COLPOSCOPY  01/2014  . COLPOSCOPY  03/2014  . CYSTOSCOPY N/A 02/14/2017   Procedure: CYSTOSCOPY;  Surgeon: Malachy Mood, MD;  Location: [facility];  Service: Gynecology;  Laterality: N/A;  . DILATION AND CURETTAGE OF UTERUS  2015  . LAPAROSCOPIC HYSTERECTOMY Bilateral 02/14/2017   Procedure: HYSTERECTOMY TOTAL LAPAROSCOPIC BILATERAL SALPINGECTOMY;  Surgeon: Malachy Mood, MD;  Location: [facility];  Service: Gynecology;  Laterality: Bilateral;  . LEG SURGERY Bilateral    as a child to correct walking   . TONSILLECTOMY      There were no  vitals filed for this visit.   Subjective Assessment - 10/01/19 1823    Subjective Patient presents to clinic 30 minutes late 2/2 to [facility] transportation picking up late. Patient communicated late arrival as soon as transport informed her of their late arrival. Patient presents without w/c and using quad cane walking independently. Patient notes she has walked over 2K steps today. Eager to work on walking on level surfaces. Also notes that she has had decreased back pain/spasm with increased walking lately.    Currently in Pain? No/denies           TREATMENT  Neuromuscular Re-education: Gait outside/inside clinic with quad cane, SBA/Mod Ind, VCs for posture/stride length. Surface predominantly level with some gradual incline/decline surfaces and occasional narrow walking space. 10 min: 3 min rest. 6 min: 2 min rest. 7 min: 1 min rest.  Patient educated throughout session on appropriate technique and form using multi-modal cueing, HEP, and activity modification. Patient articulated understanding and returned demonstration.  Patient Response to interventions: Does not report any increased pain/concern. Set goal to walk 3K steps/day.  ASSESSMENT Patient presents to clinic with excellent motivation to participate in therapy. Patient demonstrates deficits in balance, BLE strength, gait. Patient's gait continues to become more symmetrical and patient demonstrates increased endurance and stability with increased duration during today's  session and responded positively to active interventions. Patient will benefit from continued skilled therapeutic intervention to address remaining deficits in balance, BLE strength, gait in order to increase function and improve overall QOL.        PT Long Term Goals - 09/02/19 1655      PT LONG TERM GOAL #1   Title Patient will improve DGI by at least 3 points in order to demonstrate clinically significant improvement in balance and decreased risk for  falls.    Baseline 8/4: 13/24    Time 8    Period Weeks    Status New    Target Date 10/28/19      PT LONG TERM GOAL #2   Title Patient will demonstrate improved function as evidenced by a score of 64 on FOTO measure for full participation in activities at home and in the community.    Baseline 8/4: 43    Time 8    Period Weeks    Status New    Target Date 10/28/19      PT LONG TERM GOAL #3   Title Patient will increase 6MWT by at least 29m (180ft) in order to demonstrate clinically significant improvement in cardiopulmonary endurance and community ambulation.    Baseline 8/4: 272 feet, quad cane, CGA/SBA    Time 8    Period Weeks    Status New    Target Date 10/28/19      PT LONG TERM GOAL #4   Title Patient will be able to walk 0.3 mi/ 6 blocks with LRAD and carrying a load of 6# independently without LOB or unsteadiness in order to return to PLOF.    Baseline IE: 272 feet, quad cane, no external load    Time 8    Period Weeks    Status New    Target Date 10/28/19      PT LONG TERM GOAL #5   Title Patient will decrease 5TSTS by at least 3 seconds without UE support nor compensatory patterns in order to demonstrate clinically significant improvement in LE strength.    Baseline IE: 24.6 sec, no UE support    Time 8    Period Weeks    Status New    Target Date 10/28/19                 Plan - 10/01/19 1825    Clinical Impression Statement Patient presents to clinic with excellent motivation to participate in therapy. Patient demonstrates deficits in balance, BLE strength, gait. Patient's gait continues to become more symmetrical and patient demonstrates increased endurance and stability with increased duration during today's session and responded positively to active interventions. Patient will benefit from continued skilled therapeutic intervention to address remaining deficits in balance, BLE strength, gait in order to increase function and improve overall QOL.     Personal Factors and Comorbidities Age;Education;Sex;Comorbidity 3+;Fitness;Past/Current Experience;Social Background;Finances;Transportation    Comorbidities ADHD, HTN, hyperlipidemia, migraines, adjustment disorder    Examination-Activity Limitations Bathing;Dressing;Transfers;Bed Mobility;Lift;Squat;Stairs;Locomotion Level;Stand    Examination-Participation Restrictions Dentist;Yard Work;Laundry;Cleaning;Community Activity;Shop;Meal Prep    Stability/Clinical Decision Making Evolving/Moderate complexity    Rehab Potential Good    PT Frequency 2x / week    PT Duration 8 weeks    PT Treatment/Interventions ADLs/Self Care Home Management;Cryotherapy;Electrical Stimulation;Moist Heat;Gait training;Stair training;Functional mobility training;Therapeutic activities;Neuromuscular re-education;Balance training;Therapeutic exercise;Patient/family education;Orthotic Fit/Training;Scar mobilization;Manual techniques;Dry needling;Splinting;Taping;Joint Manipulations;Spinal Manipulations;Passive range of motion    PT Next Visit Plan BLE strengthening; dynamic balance    PT Home Exercise Plan  Continue with gait in home/apartment complex    Consulted and Agree with Plan of Care Patient           Patient will benefit from skilled therapeutic intervention in order to improve the following deficits and impairments:  Abnormal gait, Decreased balance, Decreased endurance, Decreased mobility, Difficulty walking, Hypomobility, Improper body mechanics, Pain, Impaired flexibility, Increased fascial restricitons, Decreased strength, Decreased safety awareness, Decreased coordination, Decreased activity tolerance, Decreased range of motion, Decreased knowledge of precautions  Visit Diagnosis: Difficulty in walking, not elsewhere classified  Muscle weakness (generalized)  Unsteadiness on feet     Problem List Patient Active Problem List   Diagnosis Date Noted  . S/P laparoscopic hysterectomy  02/14/2017   Myles Gip PT, DPT [phone]  10/01/2019, 6:29 PM  [facility] [address] [place], [place], [address] Phone: [phone]   Fax:  [phone]  Name: Ashley Rose MRN: [id number] Date of Birth: [birth year]

## 2019-10-06 ENCOUNTER — Other Ambulatory Visit: Payer: Self-pay

## 2019-10-06 ENCOUNTER — Encounter: Payer: Self-pay | Admitting: Physical Therapy

## 2019-10-06 ENCOUNTER — Ambulatory Visit: Payer: Medicaid Other | Admitting: Physical Therapy

## 2019-10-06 DIAGNOSIS — M6281 Muscle weakness (generalized): Secondary | ICD-10-CM

## 2019-10-06 DIAGNOSIS — R262 Difficulty in walking, not elsewhere classified: Secondary | ICD-10-CM

## 2019-10-06 DIAGNOSIS — R2681 Unsteadiness on feet: Secondary | ICD-10-CM

## 2019-10-06 NOTE — Therapy (Signed)
[facility] [address] [place], [place], [address] Phone: [phone]   Fax:  [phone]  Physical Therapy Treatment  Patient Details  Name: Ashley Rose MRN: [id number] Date of Birth: 04/27/1975 Referring Provider (PT): Durenda Hurt   Encounter Date: 10/06/2019   PT End of Session - 10/06/19 1656    Visit Number 6    Number of Visits 16    Date for PT Re-Evaluation 10/28/19    Authorization Type [employer]    Authorization - Visit Number 5    Authorization - Number of Visits 16    PT Start Time 6789    PT Stop Time 1352    PT Time Calculation (min) 55 min    Equipment Utilized During Treatment Gait belt    Activity Tolerance Patient tolerated treatment well    Behavior During Therapy [facility] for tasks assessed/performed           Past Medical History:  Diagnosis Date  . ADHD (attention deficit hyperactivity disorder)   . DVT (deep venous thrombosis) (HCC)    Right leg  . Family history of adverse reaction to anesthesia    grandmother had a hard time waking up  . Family history of ovarian cancer   . GERD (gastroesophageal reflux disease)   . Headache    migraines  . History of kidney stones   . Hypercholesteremia   . Hypertension   . Seizures ([place])    last seizure at age 81    Past Surgical History:  Procedure Laterality Date  . CHOLECYSTECTOMY  2004  . COLPOSCOPY  01/2014  . COLPOSCOPY  03/2014  . CYSTOSCOPY N/A 02/14/2017   Procedure: CYSTOSCOPY;  Surgeon: Malachy Mood, MD;  Location: [facility] ORS;  Service: Gynecology;  Laterality: N/A;  . DILATION AND CURETTAGE OF UTERUS  2015  . LAPAROSCOPIC HYSTERECTOMY Bilateral 02/14/2017   Procedure: HYSTERECTOMY TOTAL LAPAROSCOPIC BILATERAL SALPINGECTOMY;  Surgeon: Malachy Mood, MD;  Location: [facility] ORS;  Service: Gynecology;  Laterality: Bilateral;  . LEG SURGERY Bilateral    as a child to correct walking   . TONSILLECTOMY      There were no  vitals filed for this visit.   Subjective Assessment - 10/06/19 1654    Subjective Patient reports that she was walker bound this weekend because her LLE has been buckling/giving out on her unpredictably. She notes that she feels some of it's buckling may be from increased standing when waiting for transportation prior to last session. Patient also reports that she has had several near falls today but has not fallen. Patient reports feeling fatigued and painful in her joints today.    Currently in Pain? Yes    Pain Score 5     Pain Location Leg    Pain Orientation Left           TREATMENT  Neuromuscular Re-education: //bars:  Negotiating 2-6" obstacles with variable surface complaince, faded UE support, SBA, x4 laps  Lateral walking with obstacle negotiation 2-6" with variable surface compliance, faded UE support, SBA, x4 laps    Therapeutic Exercise: NuStep, seat 9, L3-10, spm 50-60; variable hill workout,  x10 min for improved tolerance to WB and activity, VCs for increased LLE activation Gait in hallway with quad cane for improved LE muscular endurance, SBA/CGA, 1 episode of LOB with L knee "buckling". Min/Mod A to recover balance and sit in chair.   Patient educated throughout session on appropriate technique and form using multi-modal cueing,  HEP, and activity modification. Patient articulated understanding and returned demonstration.  Patient Response to interventions: Patient reports increased fatigue.   ASSESSMENT Patient presents to clinic with excellent motivation to participate in therapy. Patient demonstrates deficits in balance, BLE strength, gait. Patient had more postural control and improved obstacle negotiation during today's session and responded positively to active interventions. Patient will benefit from continued skilled therapeutic intervention to address remaining deficits in balance, BLE strength, gait in order to increase function and improve overall  QOL.      PT Long Term Goals - 09/02/19 1655      PT LONG TERM GOAL #1   Title Patient will improve DGI by at least 3 points in order to demonstrate clinically significant improvement in balance and decreased risk for falls.    Baseline 8/4: 13/24    Time 8    Period Weeks    Status New    Target Date 10/28/19      PT LONG TERM GOAL #2   Title Patient will demonstrate improved function as evidenced by a score of 64 on FOTO measure for full participation in activities at home and in the community.    Baseline 8/4: 43    Time 8    Period Weeks    Status New    Target Date 10/28/19      PT LONG TERM GOAL #3   Title Patient will increase 6MWT by at least 46m (113ft) in order to demonstrate clinically significant improvement in cardiopulmonary endurance and community ambulation.    Baseline 8/4: 272 feet, quad cane, CGA/SBA    Time 8    Period Weeks    Status New    Target Date 10/28/19      PT LONG TERM GOAL #4   Title Patient will be able to walk 0.3 mi/ 6 blocks with LRAD and carrying a load of 6# independently without LOB or unsteadiness in order to return to PLOF.    Baseline IE: 272 feet, quad cane, no external load    Time 8    Period Weeks    Status New    Target Date 10/28/19      PT LONG TERM GOAL #5   Title Patient will decrease 5TSTS by at least 3 seconds without UE support nor compensatory patterns in order to demonstrate clinically significant improvement in LE strength.    Baseline IE: 24.6 sec, no UE support    Time 8    Period Weeks    Status New    Target Date 10/28/19                 Plan - 10/06/19 1656    Clinical Impression Statement Patient presents to clinic with excellent motivation to participate in therapy. Patient demonstrates deficits in balance, BLE strength, gait. Patient had more postural control and improved obstacle negotiation during today's session and responded positively to active interventions. Patient will benefit from  continued skilled therapeutic intervention to address remaining deficits in balance, BLE strength, gait in order to increase function and improve overall QOL.    Personal Factors and Comorbidities Age;Education;Sex;Comorbidity 3+;Fitness;Past/Current Experience;Social Background;Finances;Transportation    Comorbidities ADHD, HTN, hyperlipidemia, migraines, adjustment disorder    Examination-Activity Limitations Bathing;Dressing;Transfers;Bed Mobility;Lift;Squat;Stairs;Locomotion Level;Stand    Examination-Participation Restrictions Dentist;Yard Work;Laundry;Cleaning;Community Activity;Shop;Meal Prep    Stability/Clinical Decision Making Evolving/Moderate complexity    Rehab Potential Good    PT Frequency 2x / week    PT Duration 8 weeks    PT Treatment/Interventions  ADLs/Self Care Home Management;Cryotherapy;Electrical Stimulation;Moist Heat;Gait training;Stair training;Functional mobility training;Therapeutic activities;Neuromuscular re-education;Balance training;Therapeutic exercise;Patient/family education;Orthotic Fit/Training;Scar mobilization;Manual techniques;Dry needling;Splinting;Taping;Joint Manipulations;Spinal Manipulations;Passive range of motion    PT Next Visit Plan BLE strengthening; dynamic balance    PT Home Exercise Plan Continue with gait in home/apartment complex    Consulted and Agree with Plan of Care Patient           Patient will benefit from skilled therapeutic intervention in order to improve the following deficits and impairments:  Abnormal gait, Decreased balance, Decreased endurance, Decreased mobility, Difficulty walking, Hypomobility, Improper body mechanics, Pain, Impaired flexibility, Increased fascial restricitons, Decreased strength, Decreased safety awareness, Decreased coordination, Decreased activity tolerance, Decreased range of motion, Decreased knowledge of precautions  Visit Diagnosis: Difficulty in walking, not elsewhere classified  Muscle  weakness (generalized)  Unsteadiness on feet     Problem List Patient Active Problem List   Diagnosis Date Noted  . S/P laparoscopic hysterectomy 02/14/2017   Myles Gip PT, DPT [phone]  10/06/2019, 5:57 PM  [facility] [address] [place], [place], [address] Phone: [phone]   Fax:  [phone]  Name: TANARA TURVEY MRN: [id number] Date of Birth: 02-Sep-1975

## 2019-10-07 ENCOUNTER — Other Ambulatory Visit: Payer: Self-pay | Admitting: Nurse Practitioner

## 2019-10-07 DIAGNOSIS — R928 Other abnormal and inconclusive findings on diagnostic imaging of breast: Secondary | ICD-10-CM

## 2019-10-07 DIAGNOSIS — N6320 Unspecified lump in the left breast, unspecified quadrant: Secondary | ICD-10-CM

## 2019-10-08 ENCOUNTER — Encounter: Payer: Self-pay | Admitting: Physical Therapy

## 2019-10-08 ENCOUNTER — Ambulatory Visit: Payer: Medicaid Other | Admitting: Physical Therapy

## 2019-10-08 ENCOUNTER — Other Ambulatory Visit: Payer: Self-pay

## 2019-10-08 DIAGNOSIS — R2681 Unsteadiness on feet: Secondary | ICD-10-CM

## 2019-10-08 DIAGNOSIS — R262 Difficulty in walking, not elsewhere classified: Secondary | ICD-10-CM | POA: Diagnosis not present

## 2019-10-08 DIAGNOSIS — M6281 Muscle weakness (generalized): Secondary | ICD-10-CM

## 2019-10-08 NOTE — Therapy (Signed)
[facility] [address] [place], [place], [address] Phone: [phone]   Fax:  [phone]  Physical Therapy Treatment  Patient Details  Name: Ashley Rose MRN: [id number] Date of Birth: Apr 21, 1975 Referring Provider (PT): Durenda Hurt   Encounter Date: 10/08/2019   PT End of Session - 10/08/19 1536    Visit Number 7    Number of Visits 16    Date for PT Re-Evaluation 10/28/19    Authorization Type [employer]    Authorization - Visit Number 6    Authorization - Number of Visits 16    PT Start Time 8786    PT Stop Time 1455    PT Time Calculation (min) 58 min    Equipment Utilized During Treatment Gait belt    Activity Tolerance Patient tolerated treatment well    Behavior During Therapy WFL for tasks assessed/performed           Past Medical History:  Diagnosis Date  . ADHD (attention deficit hyperactivity disorder)   . DVT (deep venous thrombosis) (HCC)    Right leg  . Family history of adverse reaction to anesthesia    grandmother had a hard time waking up  . Family history of ovarian cancer   . GERD (gastroesophageal reflux disease)   . Headache    migraines  . History of kidney stones   . Hypercholesteremia   . Hypertension   . Seizures ([place])    last seizure at age 66    Past Surgical History:  Procedure Laterality Date  . CHOLECYSTECTOMY  2004  . COLPOSCOPY  01/2014  . COLPOSCOPY  03/2014  . CYSTOSCOPY N/A 02/14/2017   Procedure: CYSTOSCOPY;  Surgeon: Malachy Mood, MD;  Location: [facility] ORS;  Service: Gynecology;  Laterality: N/A;  . DILATION AND CURETTAGE OF UTERUS  2015  . LAPAROSCOPIC HYSTERECTOMY Bilateral 02/14/2017   Procedure: HYSTERECTOMY TOTAL LAPAROSCOPIC BILATERAL SALPINGECTOMY;  Surgeon: Malachy Mood, MD;  Location: [facility] ORS;  Service: Gynecology;  Laterality: Bilateral;  . LEG SURGERY Bilateral    as a child to correct walking   . TONSILLECTOMY      There were no  vitals filed for this visit.   Subjective Assessment - 10/08/19 1533    Subjective Patient states that she has started taking glucosamine chondroitin for her joint pain; she is feeling some joint relief from this. Patient denies any falls since her last visit and notes that she was able to walk to her mailboxes to retrieve her mail yesterday using her walker instead of her w/c.    Currently in Pain? Yes    Pain Score 3     Pain Location Leg    Pain Orientation Left          TREATMENT  Neuromuscular Re-education: //bars:  Gait on compliant surface with hidden obstacles, faded UE support, SBA/CGA, x6 laps  Negotiating 6" hurdles with hidden obstacles on compliant surface, faded UE support, SBA/CGA, x4 laps 6" step taps, BLE, SUE support, x20 each limb, SBA, no LOB 6" step up, BLE, BUE support, x10 each limb, CGA/MinA on LLE, 1 instance of LLE knee buckling, no LOB   Therapeutic Exercise: NuStep, seat 8, L6, spm 60+,  x10 min for improved tolerance to WB and activity, VCs for increased LLE activation Gait in PT gym with quad cane for improved LE muscular endurance, SBA/CGA. 1 episode of LLE knee buckling without LOB.  GTB, LLE TKE x30 for improved quad control/strength with  step up   Patient educated throughout session on appropriate technique and form using multi-modal cueing, HEP, and activity modification. Patient articulated understanding and returned demonstration.  Patient Response to interventions: Patient notes feeling her LLE fatigued  ASSESSMENT Patient presents to clinic with excellent motivation to participate in therapy. Patient demonstrates deficits in balance, BLE strength, gait. Patient able to negotiate hidden obstacles on compliant surface with SUE support and no LOB during today's session and responded positively to active interventions. Patient will benefit from continued skilled therapeutic intervention to address remaining deficits in balance, BLE strength, gait in  order to increase function and improve overall QOL.     PT Long Term Goals - 09/02/19 1655      PT LONG TERM GOAL #1   Title Patient will improve DGI by at least 3 points in order to demonstrate clinically significant improvement in balance and decreased risk for falls.    Baseline 8/4: 13/24    Time 8    Period Weeks    Status New    Target Date 10/28/19      PT LONG TERM GOAL #2   Title Patient will demonstrate improved function as evidenced by a score of 64 on FOTO measure for full participation in activities at home and in the community.    Baseline 8/4: 43    Time 8    Period Weeks    Status New    Target Date 10/28/19      PT LONG TERM GOAL #3   Title Patient will increase 6MWT by at least 43m (124ft) in order to demonstrate clinically significant improvement in cardiopulmonary endurance and community ambulation.    Baseline 8/4: 272 feet, quad cane, CGA/SBA    Time 8    Period Weeks    Status New    Target Date 10/28/19      PT LONG TERM GOAL #4   Title Patient will be able to walk 0.3 mi/ 6 blocks with LRAD and carrying a load of 6# independently without LOB or unsteadiness in order to return to PLOF.    Baseline IE: 272 feet, quad cane, no external load    Time 8    Period Weeks    Status New    Target Date 10/28/19      PT LONG TERM GOAL #5   Title Patient will decrease 5TSTS by at least 3 seconds without UE support nor compensatory patterns in order to demonstrate clinically significant improvement in LE strength.    Baseline IE: 24.6 sec, no UE support    Time 8    Period Weeks    Status New    Target Date 10/28/19                 Plan - 10/08/19 1536    Clinical Impression Statement Patient presents to clinic with excellent motivation to participate in therapy. Patient demonstrates deficits in balance, BLE strength, gait. Patient able to negotiate hidden obstacles on compliant surface with SUE support and no LOB during today's session and responded  positively to active interventions. Patient will benefit from continued skilled therapeutic intervention to address remaining deficits in balance, BLE strength, gait in order to increase function and improve overall QOL.    Personal Factors and Comorbidities Age;Education;Sex;Comorbidity 3+;Fitness;Past/Current Experience;Social Background;Finances;Transportation    Comorbidities ADHD, HTN, hyperlipidemia, migraines, adjustment disorder    Examination-Activity Limitations Bathing;Dressing;Transfers;Bed Mobility;Lift;Squat;Stairs;Locomotion Level;Stand    Examination-Participation Restrictions Dentist;Yard Work;Laundry;Cleaning;Community Activity;Shop;Meal Prep    Stability/Clinical Decision Making  Evolving/Moderate complexity    Rehab Potential Good    PT Frequency 2x / week    PT Duration 8 weeks    PT Treatment/Interventions ADLs/Self Care Home Management;Cryotherapy;Electrical Stimulation;Moist Heat;Gait training;Stair training;Functional mobility training;Therapeutic activities;Neuromuscular re-education;Balance training;Therapeutic exercise;Patient/family education;Orthotic Fit/Training;Scar mobilization;Manual techniques;Dry needling;Splinting;Taping;Joint Manipulations;Spinal Manipulations;Passive range of motion    PT Next Visit Plan BLE strengthening; dynamic balance    PT Home Exercise Plan Continue with gait in home/apartment complex    Consulted and Agree with Plan of Care Patient           Patient will benefit from skilled therapeutic intervention in order to improve the following deficits and impairments:  Abnormal gait, Decreased balance, Decreased endurance, Decreased mobility, Difficulty walking, Hypomobility, Improper body mechanics, Pain, Impaired flexibility, Increased fascial restricitons, Decreased strength, Decreased safety awareness, Decreased coordination, Decreased activity tolerance, Decreased range of motion, Decreased knowledge of precautions  Visit  Diagnosis: Difficulty in walking, not elsewhere classified  Muscle weakness (generalized)  Unsteadiness on feet     Problem List Patient Active Problem List   Diagnosis Date Noted  . S/P laparoscopic hysterectomy 02/14/2017   Myles Gip PT, DPT [phone]  10/08/2019, 3:46 PM  [facility] [address] [place], [place], [address] Phone: [phone]   Fax:  [phone]  Name: CHERYLLYNN SARFF MRN: [id number] Date of Birth: Nov 26, 1975

## 2019-10-12 ENCOUNTER — Other Ambulatory Visit: Payer: Self-pay

## 2019-10-12 ENCOUNTER — Ambulatory Visit: Payer: Medicaid Other | Admitting: Physical Therapy

## 2019-10-12 ENCOUNTER — Encounter: Payer: Self-pay | Admitting: Physical Therapy

## 2019-10-12 DIAGNOSIS — R2681 Unsteadiness on feet: Secondary | ICD-10-CM

## 2019-10-12 DIAGNOSIS — R262 Difficulty in walking, not elsewhere classified: Secondary | ICD-10-CM | POA: Diagnosis not present

## 2019-10-12 DIAGNOSIS — M6281 Muscle weakness (generalized): Secondary | ICD-10-CM

## 2019-10-12 NOTE — Therapy (Signed)
[facility] [address] [place], [place], [address] Phone: [phone]   Fax:  [phone]  Physical Therapy Treatment  Patient Details  Name: Ashley Rose MRN: [id number] Date of Birth: Oct 16, 1975 Referring Provider (PT): Durenda Hurt   Encounter Date: 10/12/2019   PT End of Session - 10/12/19 1520    Visit Number 8    Number of Visits 16    Date for PT Re-Evaluation 10/28/19    Authorization Type [employer]    Authorization - Visit Number 7    Authorization - Number of Visits 16    PT Start Time 1250    PT Stop Time 1345    PT Time Calculation (min) 55 min    Equipment Utilized During Treatment Gait belt    Activity Tolerance Patient tolerated treatment well    Behavior During Therapy [facility] for tasks assessed/performed           Past Medical History:  Diagnosis Date  . ADHD (attention deficit hyperactivity disorder)   . DVT (deep venous thrombosis) (HCC)    Right leg  . Family history of adverse reaction to anesthesia    grandmother had a hard time waking up  . Family history of ovarian cancer   . GERD (gastroesophageal reflux disease)   . Headache    migraines  . History of kidney stones   . Hypercholesteremia   . Hypertension   . Seizures ([place])    last seizure at age 59    Past Surgical History:  Procedure Laterality Date  . CHOLECYSTECTOMY  2004  . COLPOSCOPY  01/2014  . COLPOSCOPY  03/2014  . CYSTOSCOPY N/A 02/14/2017   Procedure: CYSTOSCOPY;  Surgeon: Malachy Mood, MD;  Location: [facility] ORS;  Service: Gynecology;  Laterality: N/A;  . DILATION AND CURETTAGE OF UTERUS  2015  . LAPAROSCOPIC HYSTERECTOMY Bilateral 02/14/2017   Procedure: HYSTERECTOMY TOTAL LAPAROSCOPIC BILATERAL SALPINGECTOMY;  Surgeon: Malachy Mood, MD;  Location: [facility] ORS;  Service: Gynecology;  Laterality: Bilateral;  . LEG SURGERY Bilateral    as a child to correct walking   . TONSILLECTOMY      There were no  vitals filed for this visit.   Subjective Assessment - 10/12/19 1518    Subjective Patient reports that she feels like her new supplement of glucosamine chondroitin is helping ease her joint pain. She was able to stand to make a cake this weekend and while she was fatigued in her back and legs afterwards, she was able to complete the task in standing. She continues to work on gait in her apartment building.    Currently in Pain? Yes    Pain Score 2     Pain Location Knee    Pain Orientation Left           TREATMENT  Neuromuscular Re-education: //bars:  Gait without AD, no UE support, SBA/CGA, x3 laps  Tandem gait without AD, no UE support, SBA/CGA, x3 laps  Negotiating obstacles/hurdles 2-6" faded UE support, SBA/CGA, x6 laps Gait outside clinic, QC, SBA/CGA, varied surface exposure, x600 ft, no LOB. Patient negotiating inclines, grass surface, and sidewalk with limited unsteadiness.   Therapeutic Exercise: NuStep, seat 8, L6, spm 60+,  x5 min for warm-up during history taking 5# CW, BLE strengthening:  LAQ with eccentric lower x20  Seated hamstring curl x20  Standing hip flexion x20  Standing hamstring curl x20  Standing hip abduction x20   Patient educated throughout session on appropriate technique  and form using multi-modal cueing, HEP, and activity modification. Patient articulated understanding and returned demonstration.  Patient Response to interventions: Patient notes mild fatigue but is interested in trying to negotiate steps at apartment building.  ASSESSMENT Patient presents to clinic with excellent motivation to participate in therapy. Patient demonstrates deficits in balance, BLE strength, gait. Patient able to walk on uneven grass surface with quad cane, CGA and no LOB during today's session and responded positively to active interventions. Patient will benefit from continued skilled therapeutic intervention to address remaining deficits in balance, BLE strength,  gait in order to increase function and improve overall QOL.      PT Long Term Goals - 09/02/19 1655      PT LONG TERM GOAL #1   Title Patient will improve DGI by at least 3 points in order to demonstrate clinically significant improvement in balance and decreased risk for falls.    Baseline 8/4: 13/24    Time 8    Period Weeks    Status New    Target Date 10/28/19      PT LONG TERM GOAL #2   Title Patient will demonstrate improved function as evidenced by a score of 64 on FOTO measure for full participation in activities at home and in the community.    Baseline 8/4: 43    Time 8    Period Weeks    Status New    Target Date 10/28/19      PT LONG TERM GOAL #3   Title Patient will increase 6MWT by at least 65m (120ft) in order to demonstrate clinically significant improvement in cardiopulmonary endurance and community ambulation.    Baseline 8/4: 272 feet, quad cane, CGA/SBA    Time 8    Period Weeks    Status New    Target Date 10/28/19      PT LONG TERM GOAL #4   Title Patient will be able to walk 0.3 mi/ 6 blocks with LRAD and carrying a load of 6# independently without LOB or unsteadiness in order to return to PLOF.    Baseline IE: 272 feet, quad cane, no external load    Time 8    Period Weeks    Status New    Target Date 10/28/19      PT LONG TERM GOAL #5   Title Patient will decrease 5TSTS by at least 3 seconds without UE support nor compensatory patterns in order to demonstrate clinically significant improvement in LE strength.    Baseline IE: 24.6 sec, no UE support    Time 8    Period Weeks    Status New    Target Date 10/28/19                 Plan - 10/12/19 1521    Clinical Impression Statement Patient presents to clinic with excellent motivation to participate in therapy. Patient demonstrates deficits in balance, BLE strength, gait. Patient able to walk on uneven grass surface with quad cane, CGA and no LOB during today's session and responded  positively to active interventions. Patient will benefit from continued skilled therapeutic intervention to address remaining deficits in balance, BLE strength, gait in order to increase function and improve overall QOL.    Personal Factors and Comorbidities Age;Education;Sex;Comorbidity 3+;Fitness;Past/Current Experience;Social Background;Finances;Transportation    Comorbidities ADHD, HTN, hyperlipidemia, migraines, adjustment disorder    Examination-Activity Limitations Bathing;Dressing;Transfers;Bed Mobility;Lift;Squat;Stairs;Locomotion Level;Stand    Examination-Participation Restrictions Dentist;Yard Work;Laundry;Cleaning;Community Activity;Shop;Meal Prep    Stability/Clinical Decision Making Evolving/Moderate  complexity    Rehab Potential Good    PT Frequency 2x / week    PT Duration 8 weeks    PT Treatment/Interventions ADLs/Self Care Home Management;Cryotherapy;Electrical Stimulation;Moist Heat;Gait training;Stair training;Functional mobility training;Therapeutic activities;Neuromuscular re-education;Balance training;Therapeutic exercise;Patient/family education;Orthotic Fit/Training;Scar mobilization;Manual techniques;Dry needling;Splinting;Taping;Joint Manipulations;Spinal Manipulations;Passive range of motion    PT Next Visit Plan BLE strengthening; dynamic balance    PT Home Exercise Plan Continue with gait in home/apartment complex    Consulted and Agree with Plan of Care Patient           Patient will benefit from skilled therapeutic intervention in order to improve the following deficits and impairments:  Abnormal gait, Decreased balance, Decreased endurance, Decreased mobility, Difficulty walking, Hypomobility, Improper body mechanics, Pain, Impaired flexibility, Increased fascial restricitons, Decreased strength, Decreased safety awareness, Decreased coordination, Decreased activity tolerance, Decreased range of motion, Decreased knowledge of precautions  Visit  Diagnosis: Difficulty in walking, not elsewhere classified  Muscle weakness (generalized)  Unsteadiness on feet     Problem List Patient Active Problem List   Diagnosis Date Noted  . S/P laparoscopic hysterectomy 02/14/2017   Myles Gip PT, DPT [phone]  10/12/2019, 3:32 PM  [facility] [address] [place], [place], [address] Phone: [phone]   Fax:  [phone]  Name: Ashley Rose MRN: [id number] Date of Birth: 1975-11-26

## 2019-10-15 ENCOUNTER — Other Ambulatory Visit: Payer: Self-pay

## 2019-10-15 ENCOUNTER — Encounter: Payer: Self-pay | Admitting: Physical Therapy

## 2019-10-15 ENCOUNTER — Ambulatory Visit: Payer: Medicaid Other | Admitting: Physical Therapy

## 2019-10-15 DIAGNOSIS — R2681 Unsteadiness on feet: Secondary | ICD-10-CM

## 2019-10-15 DIAGNOSIS — R262 Difficulty in walking, not elsewhere classified: Secondary | ICD-10-CM | POA: Diagnosis not present

## 2019-10-15 DIAGNOSIS — M6281 Muscle weakness (generalized): Secondary | ICD-10-CM

## 2019-10-15 NOTE — Therapy (Signed)
[facility] [address] [place], [place], [address] Phone: [phone]   Fax:  [phone]  Physical Therapy Treatment  Patient Details  Name: Ashley Rose MRN: [id number] Date of Birth: [birth year] Referring Provider (PT): Durenda Hurt   Encounter Date: 10/15/2019   PT End of Session - 10/15/19 1351    Visit Number 9    Number of Visits 16    Date for PT Re-Evaluation 10/28/19    Authorization Type [employer]    Authorization - Visit Number 8    Authorization - Number of Visits 16    PT Start Time 1351    PT Stop Time 1445    PT Time Calculation (min) 54 min    Equipment Utilized During Treatment Gait belt    Activity Tolerance Patient tolerated treatment well    Behavior During Therapy [facility] for tasks assessed/performed           Past Medical History:  Diagnosis Date  . ADHD (attention deficit hyperactivity disorder)   . DVT (deep venous thrombosis) (HCC)    Right leg  . Family history of adverse reaction to anesthesia    grandmother had a hard time waking up  . Family history of ovarian cancer   . GERD (gastroesophageal reflux disease)   . Headache    migraines  . History of kidney stones   . Hypercholesteremia   . Hypertension   . Seizures ([place])    last seizure at age 89    Past Surgical History:  Procedure Laterality Date  . CHOLECYSTECTOMY  2004  . COLPOSCOPY  01/2014  . COLPOSCOPY  03/2014  . CYSTOSCOPY N/A 02/14/2017   Procedure: CYSTOSCOPY;  Surgeon: Malachy Mood, MD;  Location: [facility] ORS;  Service: Gynecology;  Laterality: N/A;  . DILATION AND CURETTAGE OF UTERUS  2015  . LAPAROSCOPIC HYSTERECTOMY Bilateral 02/14/2017   Procedure: HYSTERECTOMY TOTAL LAPAROSCOPIC BILATERAL SALPINGECTOMY;  Surgeon: Malachy Mood, MD;  Location: [facility] ORS;  Service: Gynecology;  Laterality: Bilateral;  . LEG SURGERY Bilateral    as a child to correct walking   . TONSILLECTOMY      There were no  vitals filed for this visit.   Subjective Assessment - 10/15/19 1618    Subjective Patient presents to clinic eager to work on walking endurance. Patient notes she spent most of the morning yesterday walking around in her apartment without an AD and felt okay to do so. She does not report any falls.    Currently in Pain? Yes    Pain Score 2     Pain Location Knee           TREATMENT  Neuromuscular Re-education: Gait outside clinic, QC, SBA/CGA, 5# backpack, with varied surface exposure, no LOB. Patient negotiating inclines and sidewalk with limited unsteadiness.  3 laps of 0.08 mi: lap 1= 7 min 1 sec; lap 2= 8 min 48 sec; lap 3=7 min 8 sec. 2.5 min seated rest breaks between laps.  Therapeutic Exercise: 4# CW, BLE strengthening:  LAQ with eccentric lower x20  Seated hip flexion x20  Seated hip ER x20    Patient educated throughout session on appropriate technique and form using multi-modal cueing, HEP, and activity modification. Patient articulated understanding and returned demonstration.   ASSESSMENT Patient presents to clinic with excellent motivation to participate in therapy. Patient demonstrates deficits in balance, BLE strength, gait. Patient able to ambulate close 0.25 miles carrying 5 pound backpack with quad cane, SBA/Mod Ind  and no LOB during today's session and responded positively to active interventions. Patient will benefit from continued skilled therapeutic intervention to address remaining deficits in balance, BLE strength, gait in order to increase function and improve overall QOL.      PT Long Term Goals - 09/02/19 1655      PT LONG TERM GOAL #1   Title Patient will improve DGI by at least 3 points in order to demonstrate clinically significant improvement in balance and decreased risk for falls.    Baseline 8/4: 13/24    Time 8    Period Weeks    Status New    Target Date 10/28/19      PT LONG TERM GOAL #2   Title Patient will demonstrate improved  function as evidenced by a score of 64 on FOTO measure for full participation in activities at home and in the community.    Baseline 8/4: 43    Time 8    Period Weeks    Status New    Target Date 10/28/19      PT LONG TERM GOAL #3   Title Patient will increase 6MWT by at least 88m (173ft) in order to demonstrate clinically significant improvement in cardiopulmonary endurance and community ambulation.    Baseline 8/4: 272 feet, quad cane, CGA/SBA    Time 8    Period Weeks    Status New    Target Date 10/28/19      PT LONG TERM GOAL #4   Title Patient will be able to walk 0.3 mi/ 6 blocks with LRAD and carrying a load of 6# independently without LOB or unsteadiness in order to return to PLOF.    Baseline IE: 272 feet, quad cane, no external load    Time 8    Period Weeks    Status New    Target Date 10/28/19      PT LONG TERM GOAL #5   Title Patient will decrease 5TSTS by at least 3 seconds without UE support nor compensatory patterns in order to demonstrate clinically significant improvement in LE strength.    Baseline IE: 24.6 sec, no UE support    Time 8    Period Weeks    Status New    Target Date 10/28/19                 Plan - 10/15/19 1351    Clinical Impression Statement Patient presents to clinic with excellent motivation to participate in therapy. Patient demonstrates deficits in balance, BLE strength, gait. Patient able to ambulate close 0.25 miles carrying 5 pound backpack with quad cane, SBA/Mod Ind and no LOB during today's session and responded positively to active interventions. Patient will benefit from continued skilled therapeutic intervention to address remaining deficits in balance, BLE strength, gait in order to increase function and improve overall QOL.    Personal Factors and Comorbidities Age;Education;Sex;Comorbidity 3+;Fitness;Past/Current Experience;Social Background;Finances;Transportation    Comorbidities ADHD, HTN, hyperlipidemia, migraines,  adjustment disorder    Examination-Activity Limitations Bathing;Dressing;Transfers;Bed Mobility;Lift;Squat;Stairs;Locomotion Level;Stand    Examination-Participation Restrictions Dentist;Yard Work;Laundry;Cleaning;Community Activity;Shop;Meal Prep    Stability/Clinical Decision Making Evolving/Moderate complexity    Rehab Potential Good    PT Frequency 2x / week    PT Duration 8 weeks    PT Treatment/Interventions ADLs/Self Care Home Management;Cryotherapy;Electrical Stimulation;Moist Heat;Gait training;Stair training;Functional mobility training;Therapeutic activities;Neuromuscular re-education;Balance training;Therapeutic exercise;Patient/family education;Orthotic Fit/Training;Scar mobilization;Manual techniques;Dry needling;Splinting;Taping;Joint Manipulations;Spinal Manipulations;Passive range of motion    PT Next Visit Plan BLE strengthening; dynamic balance  PT Home Exercise Plan Continue with gait in home/apartment complex    Consulted and Agree with Plan of Care Patient           Patient will benefit from skilled therapeutic intervention in order to improve the following deficits and impairments:  Abnormal gait, Decreased balance, Decreased endurance, Decreased mobility, Difficulty walking, Hypomobility, Improper body mechanics, Pain, Impaired flexibility, Increased fascial restricitons, Decreased strength, Decreased safety awareness, Decreased coordination, Decreased activity tolerance, Decreased range of motion, Decreased knowledge of precautions  Visit Diagnosis: Difficulty in walking, not elsewhere classified  Muscle weakness (generalized)  Unsteadiness on feet     Problem List Patient Active Problem List   Diagnosis Date Noted  . S/P laparoscopic hysterectomy 02/14/2017   Myles Gip PT, DPT [phone]  10/15/2019, 4:43 PM  [facility] [address] [place], [place], [address] Phone: [phone]   Fax:   [phone]  Name: Ashley Rose MRN: [id number] Date of Birth: [birth year]

## 2019-10-16 ENCOUNTER — Ambulatory Visit
Admission: RE | Admit: 2019-10-16 | Discharge: 2019-10-16 | Disposition: A | Discharge status: Home / Self Care | Payer: Medicaid Other | Source: Ambulatory Visit | Attending: Nurse Practitioner | Admitting: Nurse Practitioner

## 2019-10-16 DIAGNOSIS — R928 Other abnormal and inconclusive findings on diagnostic imaging of breast: Secondary | ICD-10-CM

## 2019-10-16 DIAGNOSIS — N6320 Unspecified lump in the left breast, unspecified quadrant: Secondary | ICD-10-CM

## 2019-10-19 ENCOUNTER — Other Ambulatory Visit: Payer: Self-pay

## 2019-10-19 ENCOUNTER — Ambulatory Visit: Payer: Medicaid Other | Admitting: Physical Therapy

## 2019-10-19 ENCOUNTER — Encounter: Payer: Self-pay | Admitting: Physical Therapy

## 2019-10-19 DIAGNOSIS — M6281 Muscle weakness (generalized): Secondary | ICD-10-CM

## 2019-10-19 DIAGNOSIS — R262 Difficulty in walking, not elsewhere classified: Secondary | ICD-10-CM | POA: Diagnosis not present

## 2019-10-19 DIAGNOSIS — R2681 Unsteadiness on feet: Secondary | ICD-10-CM

## 2019-10-19 NOTE — Therapy (Signed)
[facility] [address]. [place], [place], [address] Phone: [phone]   Fax:  [phone]  Physical Therapy Treatment Physical Therapy Progress Note   Dates of reporting period  09/02/2019   to   10/19/2019   Patient Details  Name: Ashley Rose MRN: [id number] Date of Birth: March 23, 1975 Referring Provider (PT): Durenda Hurt   Encounter Date: 10/19/2019   PT End of Session - 10/19/19 1519    Visit Number 10    Number of Visits 16    Date for PT Re-Evaluation 10/28/19    Authorization Type [employer]    Authorization - Visit Number 9    Authorization - Number of Visits 16    PT Start Time 1300    PT Stop Time 4235    PT Time Calculation (min) 55 min    Equipment Utilized During Treatment Gait belt    Activity Tolerance Patient tolerated treatment well    Behavior During Therapy WFL for tasks assessed/performed           Past Medical History:  Diagnosis Date  . ADHD (attention deficit hyperactivity disorder)   . DVT (deep venous thrombosis) (HCC)    Right leg  . Family history of adverse reaction to anesthesia    grandmother had a hard time waking up  . Family history of ovarian cancer   . GERD (gastroesophageal reflux disease)   . Headache    migraines  . History of kidney stones   . Hypercholesteremia   . Hypertension   . Seizures ([place])    last seizure at age 47    Past Surgical History:  Procedure Laterality Date  . CHOLECYSTECTOMY  2004  . COLPOSCOPY  01/2014  . COLPOSCOPY  03/2014  . CYSTOSCOPY N/A 02/14/2017   Procedure: CYSTOSCOPY;  Surgeon: Malachy Mood, MD;  Location: [facility];  Service: Gynecology;  Laterality: N/A;  . DILATION AND CURETTAGE OF UTERUS  2015  . LAPAROSCOPIC HYSTERECTOMY Bilateral 02/14/2017   Procedure: HYSTERECTOMY TOTAL LAPAROSCOPIC BILATERAL SALPINGECTOMY;  Surgeon: Malachy Mood, MD;  Location: [facility];  Service: Gynecology;  Laterality: Bilateral;  . LEG  SURGERY Bilateral    as a child to correct walking   . TONSILLECTOMY      There were no vitals filed for this visit.   Subjective Assessment - 10/19/19 1518    Subjective Patient presents to clinic in good spirits; she notes she continues to work on walking more and less reliance on AD at home. Denies that she had any increased soreness/discomfort after last session.    Currently in Pain? Yes    Pain Score 4    at end of session          TREATMENT  Therapeutic Exercise: 5# CW, BLE strengthening:  LAQ with eccentric lower x20  Seated hip flexion x20  Standing step taps x20 Gait outside clinic, QC, SBA/CGA, with varied surface exposure, no LOB. Patient negotiating inclines and sidewalk with limited unsteadiness.  2 laps (0.18 mi): 10:52.6 sec  2 laps (0.18 mi): 12:40.3 sec   Patient educated throughout session on appropriate technique and form using multi-modal cueing, HEP, and activity modification. Patient articulated understanding and returned demonstration.   ASSESSMENT Patient presents to clinic with excellent motivation to participate in therapy. Patient demonstrates deficits in balance, BLE strength, gait. Patient able to ambulate for 2 rounds of 0.18 mi with QC, SBA during today's session and responded positively to active interventions. Patient's condition has the  potential to improve in response to therapy. Maximum improvement is yet to be obtained. The anticipated improvement is attainable and reasonable in a generally predictable time. Patient will benefit from continued skilled therapeutic intervention to address remaining deficits in balance, BLE strength, gait in order to increase function and improve overall QOL.      PT Long Term Goals - 10/19/19 1520      PT LONG TERM GOAL #1   Title Patient will improve DGI by at least 3 points in order to demonstrate clinically significant improvement in balance and decreased risk for falls.    Baseline 8/4: 13/24; 9/20:  assess at next visit    Time 8    Period Weeks    Status On-going    Target Date 10/28/19      PT LONG TERM GOAL #2   Title Patient will demonstrate improved function as evidenced by a score of 64 on FOTO measure for full participation in activities at home and in the community.    Baseline 8/4: 43; 9/20: not taken 2/2 to technical difficulties    Time 8    Period Weeks    Status On-going    Target Date 10/28/19      PT LONG TERM GOAL #3   Title Patient will increase 6MWT by at least 66m (185ft) in order to demonstrate clinically significant improvement in cardiopulmonary endurance and community ambulation.    Baseline 8/4: 272 feet, quad cane, CGA/SBA; 9/20: 475.2 ft, QC, SBA/MOD I    Time 8    Period Weeks    Status On-going    Target Date 10/28/19      PT LONG TERM GOAL #4   Title Patient will be able to walk 0.3 mi/ 6 blocks with LRAD and carrying a load of 6# independently without LOB or unsteadiness in order to return to PLOF.    Baseline IE: 272 feet, quad cane, no external load; 9/20: QC, 0.18 mi, no load    Time 8    Period Weeks    Status On-going    Target Date 10/28/19      PT LONG TERM GOAL #5   Title Patient will decrease 5TSTS by at least 3 seconds without UE support nor compensatory patterns in order to demonstrate clinically significant improvement in LE strength.    Baseline IE: 24.6 sec, no UE support; 9/20: assess at next visit    Time 8    Period Weeks    Status On-going    Target Date 10/28/19                 Plan - 10/19/19 1520    Clinical Impression Statement Patient presents to clinic with excellent motivation to participate in therapy. Patient demonstrates deficits in balance, BLE strength, gait. Patient able to ambulate for 2 rounds of 0.18 mi with QC, SBA during today's session and responded positively to active interventions. Patient's condition has the potential to improve in response to therapy. Maximum improvement is yet to be obtained.  The anticipated improvement is attainable and reasonable in a generally predictable time. Patient will benefit from continued skilled therapeutic intervention to address remaining deficits in balance, BLE strength, gait in order to increase function and improve overall QOL.    Personal Factors and Comorbidities Age;Education;Sex;Comorbidity 3+;Fitness;Past/Current Experience;Social Background;Finances;Transportation    Comorbidities ADHD, HTN, hyperlipidemia, migraines, adjustment disorder    Examination-Activity Limitations Bathing;Dressing;Transfers;Bed Mobility;Lift;Squat;Stairs;Locomotion Level;Stand    Examination-Participation Restrictions Dentist;Yard Work;Laundry;Cleaning;Community Activity;Shop;Meal Prep    Stability/Clinical  Decision Making Evolving/Moderate complexity    Rehab Potential Good    PT Frequency 2x / week    PT Duration 8 weeks    PT Treatment/Interventions ADLs/Self Care Home Management;Cryotherapy;Electrical Stimulation;Moist Heat;Gait training;Stair training;Functional mobility training;Therapeutic activities;Neuromuscular re-education;Balance training;Therapeutic exercise;Patient/family education;Orthotic Fit/Training;Scar mobilization;Manual techniques;Dry needling;Splinting;Taping;Joint Manipulations;Spinal Manipulations;Passive range of motion    PT Next Visit Plan BLE strengthening; dynamic balance    PT Home Exercise Plan Continue with gait in home/apartment complex    Consulted and Agree with Plan of Care Patient           Patient will benefit from skilled therapeutic intervention in order to improve the following deficits and impairments:  Abnormal gait, Decreased balance, Decreased endurance, Decreased mobility, Difficulty walking, Hypomobility, Improper body mechanics, Pain, Impaired flexibility, Increased fascial restricitons, Decreased strength, Decreased safety awareness, Decreased coordination, Decreased activity tolerance, Decreased range of  motion, Decreased knowledge of precautions  Visit Diagnosis: Difficulty in walking, not elsewhere classified  Muscle weakness (generalized)  Unsteadiness on feet     Problem List Patient Active Problem List   Diagnosis Date Noted  . S/P laparoscopic hysterectomy 02/14/2017   Myles Gip PT, DPT [phone]  10/19/2019, 3:34 PM  [facility] [address] [place], [place], [address] Phone: [phone]   Fax:  [phone]  Name: Ashley Rose MRN: [id number] Date of Birth: [birth year]

## 2019-10-22 ENCOUNTER — Ambulatory Visit: Payer: Medicaid Other | Admitting: Physical Therapy

## 2019-10-22 ENCOUNTER — Other Ambulatory Visit: Payer: Self-pay

## 2019-10-22 ENCOUNTER — Encounter: Payer: Self-pay | Admitting: Physical Therapy

## 2019-10-22 DIAGNOSIS — R262 Difficulty in walking, not elsewhere classified: Secondary | ICD-10-CM | POA: Diagnosis not present

## 2019-10-22 DIAGNOSIS — R2681 Unsteadiness on feet: Secondary | ICD-10-CM

## 2019-10-22 DIAGNOSIS — M6281 Muscle weakness (generalized): Secondary | ICD-10-CM

## 2019-10-22 NOTE — Therapy (Signed)
[facility] [address] [place], [place], [address] Phone: [phone]   Fax:  [phone]  Physical Therapy Treatment  Patient Details  Name: Ashley Rose MRN: [id number] Date of Birth: 1975/06/17 Referring Provider (PT): Durenda Hurt   Encounter Date: 10/22/2019   PT End of Session - 10/22/19 1403    Visit Number 11    Number of Visits 16    Date for PT Re-Evaluation 10/28/19    Authorization Type [employer]    Authorization - Visit Number 10    Authorization - Number of Visits 16    PT Start Time 6606    PT Stop Time 1450    PT Time Calculation (min) 55 min    Equipment Utilized During Treatment Gait belt    Activity Tolerance Patient tolerated treatment well    Behavior During Therapy [facility] for tasks assessed/performed           Past Medical History:  Diagnosis Date  . ADHD (attention deficit hyperactivity disorder)   . DVT (deep venous thrombosis) (HCC)    Right leg  . Family history of adverse reaction to anesthesia    grandmother had a hard time waking up  . Family history of ovarian cancer   . GERD (gastroesophageal reflux disease)   . Headache    migraines  . History of kidney stones   . Hypercholesteremia   . Hypertension   . Seizures ([place])    last seizure at age 47    Past Surgical History:  Procedure Laterality Date  . CHOLECYSTECTOMY  2004  . COLPOSCOPY  01/2014  . COLPOSCOPY  03/2014  . CYSTOSCOPY N/A 02/14/2017   Procedure: CYSTOSCOPY;  Surgeon: Malachy Mood, MD;  Location: [facility]S;  Service: Gynecology;  Laterality: N/A;  . DILATION AND CURETTAGE OF UTERUS  2015  . LAPAROSCOPIC HYSTERECTOMY Bilateral 02/14/2017   Procedure: HYSTERECTOMY TOTAL LAPAROSCOPIC BILATERAL SALPINGECTOMY;  Surgeon: Malachy Mood, MD;  Location: [facility];  Service: Gynecology;  Laterality: Bilateral;  . LEG SURGERY Bilateral    as a child to correct walking   . TONSILLECTOMY      There were no  vitals filed for this visit.   Subjective Assessment - 10/22/19 1605    Subjective Patient notes the weather the past few days has affected her joint pain. Patient adds that she has some increased stress 2/2 to her landlord attempting to evict her from her apartment and the presence of bedbugs in the apartment building.              [facility] PT Assessment - 10/22/19 0001      Standardized Balance Assessment   Standardized Balance Assessment Five Times Sit to Stand;Dynamic Gait Index    Five times sit to stand comments  22.37 sec, no UE use      Dynamic Gait Index   Level Surface Mild Impairment    Change in Gait Speed Mild Impairment    Gait with Horizontal Head Turns Mild Impairment    Gait with Vertical Head Turns Mild Impairment    Gait and Pivot Turn Mild Impairment    Step Over Obstacle Mild Impairment    Step Around Obstacles Mild Impairment    Steps Mild Impairment    Total Score 16           TREATMENT  Neuromuscular Re-education: DGI and 5TSTS reassessed see above.  6MWT with quad cane: 590 ft, no LOB, SBA/MOD I 6MWT without quad cane:  372 ft (d/c with 1 min and 20 sec remaining 2/2 to buckling of LLE), NO LOB, SBA NuStep, seat 8, L3, spm> 60 for pain modulation and decreased joint stiffness, x5 min   Patient educated throughout session on appropriate technique and form using multi-modal cueing, HEP, and activity modification. Patient articulated understanding and returned demonstration.   ASSESSMENT Patient presents to clinic with excellent motivation to participate in therapy. Patient demonstrates deficits in balance, BLE strength, gait. Patient able to perform 6MWT without AD for 372 ft, SBA which is further than on evaluation with QC during today's session and responded positively to active interventions. Patient will benefit from continued skilled therapeutic intervention to address remaining deficits in balance, BLE strength, gait in order to increase function  and improve overall QOL.       PT Long Term Goals - 10/22/19 1406      PT LONG TERM GOAL #1   Title Patient will improve DGI by at least 3 points in order to demonstrate clinically significant improvement in balance and decreased risk for falls.    Baseline 8/4: 13/24; 9/20: assess at next visit; 9/23: 16/24 (all items performed without AD except step over obstacle)    Time 8    Period Weeks    Status On-going    Target Date 10/28/19      PT LONG TERM GOAL #2   Title Patient will demonstrate improved function as evidenced by a score of 64 on FOTO measure for full participation in activities at home and in the community.    Baseline 8/4: 43; 9/20: not taken 2/2 to technical difficulties; 9/23: 38 (patient notes answering based on back, not LE)    Time 8    Period Weeks    Status On-going    Target Date 10/28/19      PT LONG TERM GOAL #3   Title Patient will increase 6MWT by at least 34m (12ft) in order to demonstrate clinically significant improvement in cardiopulmonary endurance and community ambulation.    Baseline 8/4: 272 feet, quad cane, CGA/SBA; 9/20: 475.2 ft, QC, SBA/MOD I; 9/23: 590 ft w/ QC, SBA/MOD I    Time 8    Period Weeks    Status On-going    Target Date 10/28/19      PT LONG TERM GOAL #4   Title Patient will be able to walk 0.3 mi/ 6 blocks with LRAD and carrying a load of 6# independently without LOB or unsteadiness in order to return to PLOF.    Baseline IE: 272 feet, quad cane, no external load; 9/20: QC, 0.18 mi, no load    Time 8    Period Weeks    Status On-going    Target Date 10/28/19      PT LONG TERM GOAL #5   Title Patient will decrease 5TSTS by at least 3 seconds without UE support nor compensatory patterns in order to demonstrate clinically significant improvement in LE strength.    Baseline IE: 24.6 sec, no UE support; 9/20: assess at next visit; 9/23: 22.37 sec, no UE support    Time 8    Period Weeks    Status On-going    Target Date  10/28/19                 Plan - 10/22/19 1403    Clinical Impression Statement Patient presents to clinic with excellent motivation to participate in therapy. Patient demonstrates deficits in balance, BLE strength, gait. Patient able to perform 6MWT  without AD for 372 ft, SBA which is further than on evaluation with QC during today's session and responded positively to active interventions. Patient will benefit from continued skilled therapeutic intervention to address remaining deficits in balance, BLE strength, gait in order to increase function and improve overall QOL.    Personal Factors and Comorbidities Age;Education;Sex;Comorbidity 3+;Fitness;Past/Current Experience;Social Background;Finances;Transportation    Comorbidities ADHD, HTN, hyperlipidemia, migraines, adjustment disorder    Examination-Activity Limitations Bathing;Dressing;Transfers;Bed Mobility;Lift;Squat;Stairs;Locomotion Level;Stand    Examination-Participation Restrictions Dentist;Yard Work;Laundry;Cleaning;Community Activity;Shop;Meal Prep    Stability/Clinical Decision Making Evolving/Moderate complexity    Rehab Potential Good    PT Frequency 2x / week    PT Duration 8 weeks    PT Treatment/Interventions ADLs/Self Care Home Management;Cryotherapy;Electrical Stimulation;Moist Heat;Gait training;Stair training;Functional mobility training;Therapeutic activities;Neuromuscular re-education;Balance training;Therapeutic exercise;Patient/family education;Orthotic Fit/Training;Scar mobilization;Manual techniques;Dry needling;Splinting;Taping;Joint Manipulations;Spinal Manipulations;Passive range of motion    PT Next Visit Plan BLE strengthening; dynamic balance    PT Home Exercise Plan Continue with gait in home/apartment complex    Consulted and Agree with Plan of Care Patient           Patient will benefit from skilled therapeutic intervention in order to improve the following deficits and impairments:   Abnormal gait, Decreased balance, Decreased endurance, Decreased mobility, Difficulty walking, Hypomobility, Improper body mechanics, Pain, Impaired flexibility, Increased fascial restricitons, Decreased strength, Decreased safety awareness, Decreased coordination, Decreased activity tolerance, Decreased range of motion, Decreased knowledge of precautions  Visit Diagnosis: Difficulty in walking, not elsewhere classified  Muscle weakness (generalized)  Unsteadiness on feet     Problem List Patient Active Problem List   Diagnosis Date Noted  . S/P laparoscopic hysterectomy 02/14/2017   Myles Gip PT, DPT [phone]  10/22/2019, 5:21 PM  [facility] [address] [place], [place], [address] Phone: [phone]   Fax:  [phone]  Name: Ashley Rose MRN: [id number] Date of Birth: 06-17-1975

## 2019-10-26 ENCOUNTER — Other Ambulatory Visit: Payer: Self-pay

## 2019-10-26 ENCOUNTER — Encounter: Payer: Self-pay | Admitting: Physical Therapy

## 2019-10-26 ENCOUNTER — Ambulatory Visit: Payer: Medicaid Other | Admitting: Physical Therapy

## 2019-10-26 DIAGNOSIS — R262 Difficulty in walking, not elsewhere classified: Secondary | ICD-10-CM

## 2019-10-26 DIAGNOSIS — R2681 Unsteadiness on feet: Secondary | ICD-10-CM

## 2019-10-26 DIAGNOSIS — M6281 Muscle weakness (generalized): Secondary | ICD-10-CM

## 2019-10-26 NOTE — Therapy (Signed)
[place] [facility] [address] [place], [place], [address] Phone: [phone]   Fax:  [phone]  Physical Therapy Treatment  Patient Details  Name: Ashley Rose MRN: [id number] Date of Birth: 09/17/1975 Referring Provider (PT): Durenda Hurt   Encounter Date: 10/26/2019   PT End of Session - 10/26/19 1400    Visit Number 12    Number of Visits 16    Date for PT Re-Evaluation 10/28/19    Authorization Type [employer]    Authorization - Visit Number 11    Authorization - Number of Visits 16    PT Start Time 1300    PT Stop Time 7619    PT Time Calculation (min) 55 min    Equipment Utilized During Treatment Gait belt    Activity Tolerance Patient tolerated treatment well    Behavior During Therapy WFL for tasks assessed/performed           Past Medical History:  Diagnosis Date  . ADHD (attention deficit hyperactivity disorder)   . DVT (deep venous thrombosis) (HCC)    Right leg  . Family history of adverse reaction to anesthesia    grandmother had a hard time waking up  . Family history of ovarian cancer   . GERD (gastroesophageal reflux disease)   . Headache    migraines  . History of kidney stones   . Hypercholesteremia   . Hypertension   . Seizures ([place])    last seizure at age 32    Past Surgical History:  Procedure Laterality Date  . CHOLECYSTECTOMY  2004  . COLPOSCOPY  01/2014  . COLPOSCOPY  03/2014  . CYSTOSCOPY N/A 02/14/2017   Procedure: CYSTOSCOPY;  Surgeon: Malachy Mood, MD;  Location: [facility];  Service: Gynecology;  Laterality: N/A;  . DILATION AND CURETTAGE OF UTERUS  2015  . LAPAROSCOPIC HYSTERECTOMY Bilateral 02/14/2017   Procedure: HYSTERECTOMY TOTAL LAPAROSCOPIC BILATERAL SALPINGECTOMY;  Surgeon: Malachy Mood, MD;  Location: [facility];  Service: Gynecology;  Laterality: Bilateral;  . LEG SURGERY Bilateral    as a child to correct walking   . TONSILLECTOMY      There were no  vitals filed for this visit.   Subjective Assessment - 10/26/19 1359    Subjective Patient reports high level of stress 2/2 to receiving eviction notice and needing to find new housing and meet requirements to keep appropriate supports in place.    Currently in Pain? No/denies           TREATMENT  Neuromuscular Re-education: Gait outside clinic, QC, SBA/CGA, with varied surface exposure, no LOB. Patient negotiating inclines and sidewalk with limited unsteadiness. 2x 0.09 mi Gait in clinic, no AD, SBA, 3x150 feet, VCs for L knee flexion in swing phase  Therapeutic Exercise: NuStep, L5, spm>70, seat 8, x10 min for improved tolerance to L knee flexion and endurance STS 2x 10, no UE support Mini squat (with chair) x5, x10, no UE support   Patient educated throughout session on appropriate technique and form using multi-modal cueing, HEP, and activity modification. Patient articulated understanding and returned demonstration.   ASSESSMENT Patient presents to clinic with excellent motivation to participate in therapy. Patient demonstrates deficits in balance, BLE strength, gait. Patient able to ambulate for increased distances without AD during today's session and responded positively to active interventions. Patient will benefit from continued skilled therapeutic intervention to address remaining deficits in balance, BLE strength, gait in order to increase function and improve overall QOL.  PT Long Term Goals - 10/22/19 1406      PT LONG TERM GOAL #1   Title Patient will improve DGI by at least 3 points in order to demonstrate clinically significant improvement in balance and decreased risk for falls.    Baseline 8/4: 13/24; 9/20: assess at next visit; 9/23: 16/24 (all items performed without AD except step over obstacle)    Time 8    Period Weeks    Status On-going    Target Date 10/28/19      PT LONG TERM GOAL #2   Title Patient will demonstrate improved function as  evidenced by a score of 64 on FOTO measure for full participation in activities at home and in the community.    Baseline 8/4: 43; 9/20: not taken 2/2 to technical difficulties; 9/23: 38 (patient notes answering based on back, not LE)    Time 8    Period Weeks    Status On-going    Target Date 10/28/19      PT LONG TERM GOAL #3   Title Patient will increase 6MWT by at least 40m (154ft) in order to demonstrate clinically significant improvement in cardiopulmonary endurance and community ambulation.    Baseline 8/4: 272 feet, quad cane, CGA/SBA; 9/20: 475.2 ft, QC, SBA/MOD I; 9/23: 590 ft w/ QC, SBA/MOD I    Time 8    Period Weeks    Status On-going    Target Date 10/28/19      PT LONG TERM GOAL #4   Title Patient will be able to walk 0.3 mi/ 6 blocks with LRAD and carrying a load of 6# independently without LOB or unsteadiness in order to return to PLOF.    Baseline IE: 272 feet, quad cane, no external load; 9/20: QC, 0.18 mi, no load    Time 8    Period Weeks    Status On-going    Target Date 10/28/19      PT LONG TERM GOAL #5   Title Patient will decrease 5TSTS by at least 3 seconds without UE support nor compensatory patterns in order to demonstrate clinically significant improvement in LE strength.    Baseline IE: 24.6 sec, no UE support; 9/20: assess at next visit; 9/23: 22.37 sec, no UE support    Time 8    Period Weeks    Status On-going    Target Date 10/28/19                 Plan - 10/26/19 1400    Clinical Impression Statement Patient presents to clinic with excellent motivation to participate in therapy. Patient demonstrates deficits in balance, BLE strength, gait. Patient able to ambulate for increased distances without AD during today's session and responded positively to active interventions. Patient will benefit from continued skilled therapeutic intervention to address remaining deficits in balance, BLE strength, gait in order to increase function and improve  overall QOL.    Personal Factors and Comorbidities Age;Education;Sex;Comorbidity 3+;Fitness;Past/Current Experience;Social Background;Finances;Transportation    Comorbidities ADHD, HTN, hyperlipidemia, migraines, adjustment disorder    Examination-Activity Limitations Bathing;Dressing;Transfers;Bed Mobility;Lift;Squat;Stairs;Locomotion Level;Stand    Examination-Participation Restrictions Dentist;Yard Work;Laundry;Cleaning;Community Activity;Shop;Meal Prep    Stability/Clinical Decision Making Evolving/Moderate complexity    Rehab Potential Good    PT Frequency 2x / week    PT Duration 8 weeks    PT Treatment/Interventions ADLs/Self Care Home Management;Cryotherapy;Electrical Stimulation;Moist Heat;Gait training;Stair training;Functional mobility training;Therapeutic activities;Neuromuscular re-education;Balance training;Therapeutic exercise;Patient/family education;Orthotic Fit/Training;Scar mobilization;Manual techniques;Dry needling;Splinting;Taping;Joint Manipulations;Spinal Manipulations;Passive range of motion  PT Next Visit Plan BLE strengthening; dynamic balance    PT Home Exercise Plan Continue with gait in home/apartment complex    Consulted and Agree with Plan of Care Patient           Patient will benefit from skilled therapeutic intervention in order to improve the following deficits and impairments:  Abnormal gait, Decreased balance, Decreased endurance, Decreased mobility, Difficulty walking, Hypomobility, Improper body mechanics, Pain, Impaired flexibility, Increased fascial restricitons, Decreased strength, Decreased safety awareness, Decreased coordination, Decreased activity tolerance, Decreased range of motion, Decreased knowledge of precautions  Visit Diagnosis: Difficulty in walking, not elsewhere classified  Muscle weakness (generalized)  Unsteadiness on feet     Problem List Patient Active Problem List   Diagnosis Date Noted  . S/P laparoscopic  hysterectomy 02/14/2017   Myles Gip PT, DPT [phone]  10/26/2019, 3:51 PM  [facility] [address] [place], [place], [address] Phone: [phone]   Fax:  [phone]  Name: Ashley Rose MRN: [id number] Date of Birth: 1975/07/17

## 2019-10-29 ENCOUNTER — Other Ambulatory Visit: Payer: Self-pay

## 2019-10-29 ENCOUNTER — Ambulatory Visit: Payer: Medicaid Other | Admitting: Physical Therapy

## 2019-10-29 ENCOUNTER — Encounter: Payer: Self-pay | Admitting: Physical Therapy

## 2019-10-29 DIAGNOSIS — R262 Difficulty in walking, not elsewhere classified: Secondary | ICD-10-CM | POA: Diagnosis not present

## 2019-10-29 DIAGNOSIS — R2681 Unsteadiness on feet: Secondary | ICD-10-CM

## 2019-10-29 DIAGNOSIS — M6281 Muscle weakness (generalized): Secondary | ICD-10-CM

## 2019-10-29 NOTE — Therapy (Signed)
[place] [facility] [address] [place], [place], [address] Phone: [phone]   Fax:  [phone]  Physical Therapy Treatment  Patient Details  Name: Ashley Rose MRN: [id number] Date of Birth: [birth year] Referring Provider (PT): Durenda Hurt   Encounter Date: 10/29/2019   PT End of Session - 10/29/19 1404    Visit Number 13    Number of Visits 16    Date for PT Re-Evaluation 10/28/19    Authorization Type [employer]    Authorization - Visit Number 12    Authorization - Number of Visits 16    PT Start Time 1350    PT Stop Time 1445    PT Time Calculation (min) 55 min    Equipment Utilized During Treatment Gait belt    Activity Tolerance Patient tolerated treatment well    Behavior During Therapy WFL for tasks assessed/performed           Past Medical History:  Diagnosis Date  . ADHD (attention deficit hyperactivity disorder)   . DVT (deep venous thrombosis) (HCC)    Right leg  . Family history of adverse reaction to anesthesia    grandmother had a hard time waking up  . Family history of ovarian cancer   . GERD (gastroesophageal reflux disease)   . Headache    migraines  . History of kidney stones   . Hypercholesteremia   . Hypertension   . Seizures ([place])    last seizure at age 38    Past Surgical History:  Procedure Laterality Date  . CHOLECYSTECTOMY  2004  . COLPOSCOPY  01/2014  . COLPOSCOPY  03/2014  . CYSTOSCOPY N/A 02/14/2017   Procedure: CYSTOSCOPY;  Surgeon: Malachy Mood, MD;  Location: [facility];  Service: Gynecology;  Laterality: N/A;  . DILATION AND CURETTAGE OF UTERUS  2015  . LAPAROSCOPIC HYSTERECTOMY Bilateral 02/14/2017   Procedure: HYSTERECTOMY TOTAL LAPAROSCOPIC BILATERAL SALPINGECTOMY;  Surgeon: Malachy Mood, MD;  Location: [facility];  Service: Gynecology;  Laterality: Bilateral;  . LEG SURGERY Bilateral    as a child to correct walking   . TONSILLECTOMY      There were no  vitals filed for this visit.   Subjective Assessment - 10/29/19 1402    Subjective Patient reports she spent the majority of the past two days ambulating without her QC in her apartment. She continues to work to find a new place to live. Patient also notes she feels the weather wil be changing as she is having more LLE joint pain.    Currently in Pain? Yes    Pain Score 5     Pain Location Knee    Pain Orientation Left           TREATMENT  Neuromuscular Re-education: Gait outside clinic, QC, SBA/CGA, with varied surface exposure, no LOB. Patient negotiating inclines and sidewalk with limited unsteadiness. x 0.09 mi // bars: obstacle negotiation (2-4"), faded UE support, VCs for posture, no LOB, SBA.   Therapeutic Exercise: NuStep, L7, spm>60, seat 8, x10 min for improved tolerance to L knee flexion and endurance STS 2x 10, BUE support, on airex pad Resisted walking // bars for improved trunk strength and hip extension strength. Fwd/bwd/lateral x5   Patient educated throughout session on appropriate technique and form using multi-modal cueing, HEP, and activity modification. Patient articulated understanding and returned demonstration.   ASSESSMENT Patient presents to clinic with excellent motivation to participate in therapy. Patient demonstrates deficits in balance, BLE strength,  gait. Patient negotiating obstacles without UE support with good postural control during today's session and responded positively to active interventions. Patient continues to decrease reliance on AD as well as tolerance to ambulation over varied surfaces for increased distances (0.18 mi). Patient's condition has the potential to improve in response to therapy. Maximum improvement is yet to be obtained. The anticipated improvement is attainable and reasonable in a generally predictable time. Patient will benefit from continued skilled therapeutic intervention to address remaining deficits in balance, BLE  strength, gait in order to increase function and improve overall QOL.      PT Long Term Goals - 10/29/19 1556      PT LONG TERM GOAL #1   Title Patient will improve DGI by at least 3 points in order to demonstrate clinically significant improvement in balance and decreased risk for falls.    Baseline 8/4: 13/24; 9/20: assess at next visit; 9/23: 16/24 (all items performed without AD except step over obstacle)    Time 8    Period Weeks    Status On-going    Target Date 12/24/19      PT LONG TERM GOAL #2   Title Patient will demonstrate improved function as evidenced by a score of 64 on FOTO measure for full participation in activities at home and in the community.    Baseline 8/4: 43; 9/20: not taken 2/2 to technical difficulties; 9/23: 38 (patient notes answering based on back, not LE)    Time 8    Period Weeks    Status On-going    Target Date 12/24/19      PT LONG TERM GOAL #3   Title Patient will increase 6MWT by at least 24m (118ft) in order to demonstrate clinically significant improvement in cardiopulmonary endurance and community ambulation.    Baseline 8/4: 272 feet, quad cane, CGA/SBA; 9/20: 475.2 ft, QC, SBA/MOD I; 9/23: 590 ft w/ QC, SBA/MOD I    Time 8    Period Weeks    Status On-going    Target Date 12/24/19      PT LONG TERM GOAL #4   Title Patient will be able to walk 0.3 mi/ 6 blocks with LRAD and carrying a load of 6# independently without LOB or unsteadiness in order to return to PLOF.    Baseline IE: 272 feet, quad cane, no external load; 9/20: QC, 0.18 mi, no load    Time 8    Period Weeks    Status On-going    Target Date 12/24/19      PT LONG TERM GOAL #5   Title Patient will decrease 5TSTS by at least 3 seconds without UE support nor compensatory patterns in order to demonstrate clinically significant improvement in LE strength.    Baseline IE: 24.6 sec, no UE support; 9/20: assess at next visit; 9/23: 22.37 sec, no UE support    Time 8    Period  Weeks    Status On-going    Target Date 12/24/19                 Plan - 10/29/19 1405    Clinical Impression Statement Patient presents to clinic with excellent motivation to participate in therapy. Patient demonstrates deficits in balance, BLE strength, gait. Patient negotiating obstacles without UE support with good postural control during today's session and responded positively to active interventions. Patient continues to decrease reliance on AD as well as tolerance to ambulation over varied surfaces for increased distances (0.18 mi). Patient's condition  has the potential to improve in response to therapy. Maximum improvement is yet to be obtained. The anticipated improvement is attainable and reasonable in a generally predictable time. Patient will benefit from continued skilled therapeutic intervention to address remaining deficits in balance, BLE strength, gait in order to increase function and improve overall QOL.    Personal Factors and Comorbidities Age;Education;Sex;Comorbidity 3+;Fitness;Past/Current Experience;Social Background;Finances;Transportation    Comorbidities ADHD, HTN, hyperlipidemia, migraines, adjustment disorder    Examination-Activity Limitations Bathing;Dressing;Transfers;Bed Mobility;Lift;Squat;Stairs;Locomotion Level;Stand    Examination-Participation Restrictions Dentist;Yard Work;Laundry;Cleaning;Community Activity;Shop;Meal Prep    Stability/Clinical Decision Making Evolving/Moderate complexity    Rehab Potential Good    PT Frequency 1x / week    PT Duration 8 weeks    PT Treatment/Interventions ADLs/Self Care Home Management;Cryotherapy;Electrical Stimulation;Moist Heat;Gait training;Stair training;Functional mobility training;Therapeutic activities;Neuromuscular re-education;Balance training;Therapeutic exercise;Patient/family education;Orthotic Fit/Training;Scar mobilization;Manual techniques;Dry needling;Splinting;Taping;Joint  Manipulations;Spinal Manipulations;Passive range of motion    PT Next Visit Plan 0.25 mi gait w/ QC, BLE strength progression    PT Home Exercise Plan Continue with gait in home/apartment complex    Consulted and Agree with Plan of Care Patient           Patient will benefit from skilled therapeutic intervention in order to improve the following deficits and impairments:  Abnormal gait, Decreased balance, Decreased endurance, Decreased mobility, Difficulty walking, Hypomobility, Improper body mechanics, Pain, Impaired flexibility, Increased fascial restricitons, Decreased strength, Decreased safety awareness, Decreased coordination, Decreased activity tolerance, Decreased range of motion, Decreased knowledge of precautions  Visit Diagnosis: Difficulty in walking, not elsewhere classified - Plan: PT plan of care cert/re-cert  Muscle weakness (generalized) - Plan: PT plan of care cert/re-cert  Unsteadiness on feet - Plan: PT plan of care cert/re-cert     Problem List Patient Active Problem List   Diagnosis Date Noted  . S/P laparoscopic hysterectomy 02/14/2017   Myles Gip PT, DPT [phone]  10/29/2019, 4:00 PM  [place] [facility] [address] [place], [place], [address] Phone: [phone]   Fax:  [phone]  Name: Ashley Rose MRN: [id number] Date of Birth: 11/05/1975

## 2019-11-05 ENCOUNTER — Ambulatory Visit: Payer: Medicaid Other | Attending: Orthopedic Surgery | Admitting: Physical Therapy

## 2019-11-05 ENCOUNTER — Other Ambulatory Visit: Payer: Self-pay

## 2019-11-05 ENCOUNTER — Encounter: Payer: Self-pay | Admitting: Physical Therapy

## 2019-11-05 DIAGNOSIS — R262 Difficulty in walking, not elsewhere classified: Secondary | ICD-10-CM | POA: Insufficient documentation

## 2019-11-05 DIAGNOSIS — R2681 Unsteadiness on feet: Secondary | ICD-10-CM | POA: Diagnosis present

## 2019-11-05 DIAGNOSIS — M6281 Muscle weakness (generalized): Secondary | ICD-10-CM | POA: Diagnosis present

## 2019-11-05 DIAGNOSIS — M79662 Pain in left lower leg: Secondary | ICD-10-CM | POA: Insufficient documentation

## 2019-11-05 NOTE — Therapy (Signed)
[facility] [address] [place], [place], [address] Phone: [phone]   Fax:  [phone]  Physical Therapy Treatment  Patient Details  Name: Ashley Rose MRN: [id number] Date of Birth: Mar 18, 1975 Referring Provider (PT): Durenda Hurt   Encounter Date: 11/05/2019   PT End of Session - 11/05/19 1523    Visit Number 14    Number of Visits 24    Date for PT Re-Evaluation 12/24/19    Authorization Type [employer]    Authorization - Visit Number 1    Authorization - Number of Visits 14    PT Start Time 9892    PT Stop Time 1345    PT Time Calculation (min) 60 min    Equipment Utilized During Treatment Gait belt    Activity Tolerance Patient tolerated treatment well    Behavior During Therapy WFL for tasks assessed/performed           Past Medical History:  Diagnosis Date  . ADHD (attention deficit hyperactivity disorder)   . DVT (deep venous thrombosis) (HCC)    Right leg  . Family history of adverse reaction to anesthesia    grandmother had a hard time waking up  . Family history of ovarian cancer   . GERD (gastroesophageal reflux disease)   . Headache    migraines  . History of kidney stones   . Hypercholesteremia   . Hypertension   . Seizures ([place])    last seizure at age 36    Past Surgical History:  Procedure Laterality Date  . CHOLECYSTECTOMY  2004  . COLPOSCOPY  01/2014  . COLPOSCOPY  03/2014  . CYSTOSCOPY N/A 02/14/2017   Procedure: CYSTOSCOPY;  Surgeon: Malachy Mood, MD;  Location: [facility]S;  Service: Gynecology;  Laterality: N/A;  . DILATION AND CURETTAGE OF UTERUS  2015  . LAPAROSCOPIC HYSTERECTOMY Bilateral 02/14/2017   Procedure: HYSTERECTOMY TOTAL LAPAROSCOPIC BILATERAL SALPINGECTOMY;  Surgeon: Malachy Mood, MD;  Location: [facility];  Service: Gynecology;  Laterality: Bilateral;  . LEG SURGERY Bilateral    as a child to correct walking   . TONSILLECTOMY      There were no  vitals filed for this visit.   Subjective Assessment - 11/05/19 1520    Subjective Patient states that she continues to seek new housing. She notes that she is better able to sit with R leg crossed underneath her, but LLE is unable to get in that position fully. Also notes some increased R knee buckling.    Currently in Pain? Yes    Pain Score 5           TREATMENT  Therapeutic Exercise: NuStep, L5-8, spm>60, seat 8, x10 min for improved tolerance to L knee flexion and endurance Gait in hallway without AD, SBA, x300 ft, 1 episode of R knee buckling with independent balance recovery BLE strengthening, 5# CW, 2x15 each: hip flexion march, hip abduction, hip extension   Patient educated throughout session on appropriate technique and form using multi-modal cueing, HEP, and activity modification. Patient articulated understanding and returned demonstration.   ASSESSMENT Patient presents to clinic with excellent motivation to participate in therapy. Patient demonstrates deficits in balance, BLE strength, gait. Patient with increased R knee buckling during today's session but responded positively to active interventions. Patient will benefit from continued skilled therapeutic intervention to address remaining deficits in balance, BLE strength, gait in order to increase function and improve overall QOL.     PT Long Term Goals -  10/29/19 1556      PT LONG TERM GOAL #1   Title Patient will improve DGI by at least 3 points in order to demonstrate clinically significant improvement in balance and decreased risk for falls.    Baseline 8/4: 13/24; 9/20: assess at next visit; 9/23: 16/24 (all items performed without AD except step over obstacle)    Time 8    Period Weeks    Status On-going    Target Date 12/24/19      PT LONG TERM GOAL #2   Title Patient will demonstrate improved function as evidenced by a score of 64 on FOTO measure for full participation in activities at home and in the  community.    Baseline 8/4: 43; 9/20: not taken 2/2 to technical difficulties; 9/23: 38 (patient notes answering based on back, not LE)    Time 8    Period Weeks    Status On-going    Target Date 12/24/19      PT LONG TERM GOAL #3   Title Patient will increase 6MWT by at least 73m (170ft) in order to demonstrate clinically significant improvement in cardiopulmonary endurance and community ambulation.    Baseline 8/4: 272 feet, quad cane, CGA/SBA; 9/20: 475.2 ft, QC, SBA/MOD I; 9/23: 590 ft w/ QC, SBA/MOD I    Time 8    Period Weeks    Status On-going    Target Date 12/24/19      PT LONG TERM GOAL #4   Title Patient will be able to walk 0.3 mi/ 6 blocks with LRAD and carrying a load of 6# independently without LOB or unsteadiness in order to return to PLOF.    Baseline IE: 272 feet, quad cane, no external load; 9/20: QC, 0.18 mi, no load    Time 8    Period Weeks    Status On-going    Target Date 12/24/19      PT LONG TERM GOAL #5   Title Patient will decrease 5TSTS by at least 3 seconds without UE support nor compensatory patterns in order to demonstrate clinically significant improvement in LE strength.    Baseline IE: 24.6 sec, no UE support; 9/20: assess at next visit; 9/23: 22.37 sec, no UE support    Time 8    Period Weeks    Status On-going    Target Date 12/24/19                 Plan - 11/05/19 1523    Clinical Impression Statement Patient presents to clinic with excellent motivation to participate in therapy. Patient demonstrates deficits in balance, BLE strength, gait. Patient with increased R knee buckling during today's session but responded positively to active interventions. Patient will benefit from continued skilled therapeutic intervention to address remaining deficits in balance, BLE strength, gait in order to increase function and improve overall QOL.    Personal Factors and Comorbidities Age;Education;Sex;Comorbidity 3+;Fitness;Past/Current  Experience;Social Background;Finances;Transportation    Comorbidities ADHD, HTN, hyperlipidemia, migraines, adjustment disorder    Examination-Activity Limitations Bathing;Dressing;Transfers;Bed Mobility;Lift;Squat;Stairs;Locomotion Level;Stand    Examination-Participation Restrictions Dentist;Yard Work;Laundry;Cleaning;Community Activity;Shop;Meal Prep    Stability/Clinical Decision Making Evolving/Moderate complexity    Rehab Potential Good    PT Frequency 1x / week    PT Duration 8 weeks    PT Treatment/Interventions ADLs/Self Care Home Management;Cryotherapy;Electrical Stimulation;Moist Heat;Gait training;Stair training;Functional mobility training;Therapeutic activities;Neuromuscular re-education;Balance training;Therapeutic exercise;Patient/family education;Orthotic Fit/Training;Scar mobilization;Manual techniques;Dry needling;Splinting;Taping;Joint Manipulations;Spinal Manipulations;Passive range of motion    PT Next Visit Plan 0.25 mi gait w/  QC, BLE strength progression    PT Home Exercise Plan Continue with gait in home/apartment complex    Consulted and Agree with Plan of Care Patient           Patient will benefit from skilled therapeutic intervention in order to improve the following deficits and impairments:  Abnormal gait, Decreased balance, Decreased endurance, Decreased mobility, Difficulty walking, Hypomobility, Improper body mechanics, Pain, Impaired flexibility, Increased fascial restricitons, Decreased strength, Decreased safety awareness, Decreased coordination, Decreased activity tolerance, Decreased range of motion, Decreased knowledge of precautions  Visit Diagnosis: Difficulty in walking, not elsewhere classified  Muscle weakness (generalized)  Unsteadiness on feet     Problem List Patient Active Problem List   Diagnosis Date Noted  . S/P laparoscopic hysterectomy 02/14/2017   Myles Gip PT, DPT [phone]  11/05/2019, 3:28 PM  [facility] [address] [place], [place], [address] Phone: [phone]   Fax:  [phone]  Name: Ashley Rose MRN: [id number] Date of Birth: 1975/06/26

## 2019-11-10 ENCOUNTER — Encounter: Payer: Self-pay | Admitting: Physical Therapy

## 2019-11-10 ENCOUNTER — Other Ambulatory Visit: Payer: Self-pay

## 2019-11-10 ENCOUNTER — Ambulatory Visit: Payer: Medicaid Other | Admitting: Physical Therapy

## 2019-11-10 DIAGNOSIS — R2681 Unsteadiness on feet: Secondary | ICD-10-CM

## 2019-11-10 DIAGNOSIS — R262 Difficulty in walking, not elsewhere classified: Secondary | ICD-10-CM

## 2019-11-10 DIAGNOSIS — M6281 Muscle weakness (generalized): Secondary | ICD-10-CM

## 2019-11-10 NOTE — Therapy (Signed)
[facility] [address] [place], [place], [address] Phone: [phone]   Fax:  [phone]  Physical Therapy Treatment  Patient Details  Name: Ashley Rose MRN: [id number] Date of Birth: 11-Mar-1975 Referring Provider (PT): Durenda Hurt   Encounter Date: 11/10/2019   PT End of Session - 11/10/19 1323    Visit Number 15    Number of Visits 24    Date for PT Re-Evaluation 12/24/19    Authorization Type [employer]    Authorization - Visit Number 2    Authorization - Number of Visits 14    PT Start Time 1300    PT Stop Time 1355    PT Time Calculation (min) 55 min    Equipment Utilized During Treatment Gait belt    Activity Tolerance Patient tolerated treatment well    Behavior During Therapy WFL for tasks assessed/performed           Past Medical History:  Diagnosis Date  . ADHD (attention deficit hyperactivity disorder)   . DVT (deep venous thrombosis) (HCC)    Right leg  . Family history of adverse reaction to anesthesia    grandmother had a hard time waking up  . Family history of ovarian cancer   . GERD (gastroesophageal reflux disease)   . Headache    migraines  . History of kidney stones   . Hypercholesteremia   . Hypertension   . Seizures ([place])    last seizure at age 74    Past Surgical History:  Procedure Laterality Date  . CHOLECYSTECTOMY  2004  . COLPOSCOPY  01/2014  . COLPOSCOPY  03/2014  . CYSTOSCOPY N/A 02/14/2017   Procedure: CYSTOSCOPY;  Surgeon: Malachy Mood, MD;  Location: [facility];  Service: Gynecology;  Laterality: N/A;  . DILATION AND CURETTAGE OF UTERUS  2015  . LAPAROSCOPIC HYSTERECTOMY Bilateral 02/14/2017   Procedure: HYSTERECTOMY TOTAL LAPAROSCOPIC BILATERAL SALPINGECTOMY;  Surgeon: Malachy Mood, MD;  Location: [facility];  Service: Gynecology;  Laterality: Bilateral;  . LEG SURGERY Bilateral    as a child to correct walking   . TONSILLECTOMY      There were no  vitals filed for this visit.   Subjective Assessment - 11/10/19 1321    Subjective Patient reports that she has secured housing. She notes that [place], but used her w/c to get there 2/2 to the distance. Patient notes that she has some R knee pain with R hip ER. Denies any falls or significant changes.    Currently in Pain? No/denies           TREATMENT  Therapeutic Exercise: NuStep, L5-8, spm>60, seat 8, x10 min for improved tolerance to L knee flexion and endurance Gait in hallway alternating with QC and without every 50 ft, SBA, x300 ft BLE seated strengthening, 5# CW, 2x15 each: hip flexion march, LAQ, hip flexion+ER BLE standing strengthening, 5# CW, 2x15 each: hip flexion march, hip abduction, hip extension  Patient educated throughout session on appropriate technique and form using multi-modal cueing, HEP, and activity modification. Patient articulated understanding and returned demonstration.   ASSESSMENT Patient presents to clinic with excellent motivation to participate in therapy. Patient demonstrates deficits in balance, BLE strength, gait. Patient able to perform increased BLE strengthening during today's session and responded positively to active interventions. Patient will benefit from continued skilled therapeutic intervention to address remaining deficits in balance, BLE strength, gait in order to increase function  and improve overall QOL.     PT Long Term Goals - 10/29/19 1556      PT LONG TERM GOAL #1   Title Patient will improve DGI by at least 3 points in order to demonstrate clinically significant improvement in balance and decreased risk for falls.    Baseline 8/4: 13/24; 9/20: assess at next visit; 9/23: 16/24 (all items performed without AD except step over obstacle)    Time 8    Period Weeks    Status On-going    Target Date 12/24/19      PT LONG TERM GOAL #2   Title Patient will demonstrate improved function as  evidenced by a score of 64 on FOTO measure for full participation in activities at home and in the community.    Baseline 8/4: 43; 9/20: not taken 2/2 to technical difficulties; 9/23: 38 (patient notes answering based on back, not LE)    Time 8    Period Weeks    Status On-going    Target Date 12/24/19      PT LONG TERM GOAL #3   Title Patient will increase 6MWT by at least 27m (15ft) in order to demonstrate clinically significant improvement in cardiopulmonary endurance and community ambulation.    Baseline 8/4: 272 feet, quad cane, CGA/SBA; 9/20: 475.2 ft, QC, SBA/MOD I; 9/23: 590 ft w/ QC, SBA/MOD I    Time 8    Period Weeks    Status On-going    Target Date 12/24/19      PT LONG TERM GOAL #4   Title Patient will be able to walk 0.3 mi/ 6 blocks with LRAD and carrying a load of 6# independently without LOB or unsteadiness in order to return to PLOF.    Baseline IE: 272 feet, quad cane, no external load; 9/20: QC, 0.18 mi, no load    Time 8    Period Weeks    Status On-going    Target Date 12/24/19      PT LONG TERM GOAL #5   Title Patient will decrease 5TSTS by at least 3 seconds without UE support nor compensatory patterns in order to demonstrate clinically significant improvement in LE strength.    Baseline IE: 24.6 sec, no UE support; 9/20: assess at next visit; 9/23: 22.37 sec, no UE support    Time 8    Period Weeks    Status On-going    Target Date 12/24/19                 Plan - 11/10/19 1323    Clinical Impression Statement Patient presents to clinic with excellent motivation to participate in therapy. Patient demonstrates deficits in balance, BLE strength, gait. Patient able to perform increased BLE strengthening during today's session and responded positively to active interventions. Patient will benefit from continued skilled therapeutic intervention to address remaining deficits in balance, BLE strength, gait in order to increase function and improve overall  QOL.    Personal Factors and Comorbidities Age;Education;Sex;Comorbidity 3+;Fitness;Past/Current Experience;Social Background;Finances;Transportation    Comorbidities ADHD, HTN, hyperlipidemia, migraines, adjustment disorder    Examination-Activity Limitations Bathing;Dressing;Transfers;Bed Mobility;Lift;Squat;Stairs;Locomotion Level;Stand    Examination-Participation Restrictions Dentist;Yard Work;Laundry;Cleaning;Community Activity;Shop;Meal Prep    Stability/Clinical Decision Making Evolving/Moderate complexity    Rehab Potential Good    PT Frequency 1x / week    PT Duration 8 weeks    PT Treatment/Interventions ADLs/Self Care Home Management;Cryotherapy;Electrical Stimulation;Moist Heat;Gait training;Stair training;Functional mobility training;Therapeutic activities;Neuromuscular re-education;Balance training;Therapeutic exercise;Patient/family education;Orthotic Fit/Training;Scar mobilization;Manual techniques;Dry needling;Splinting;Taping;Joint Manipulations;Spinal Manipulations;Passive  range of motion    PT Next Visit Plan 0.25 mi gait w/ QC, BLE strength progression    PT Home Exercise Plan Continue with gait in home/apartment complex    Consulted and Agree with Plan of Care Patient           Patient will benefit from skilled therapeutic intervention in order to improve the following deficits and impairments:  Abnormal gait, Decreased balance, Decreased endurance, Decreased mobility, Difficulty walking, Hypomobility, Improper body mechanics, Pain, Impaired flexibility, Increased fascial restricitons, Decreased strength, Decreased safety awareness, Decreased coordination, Decreased activity tolerance, Decreased range of motion, Decreased knowledge of precautions  Visit Diagnosis: Difficulty in walking, not elsewhere classified  Muscle weakness (generalized)  Unsteadiness on feet     Problem List Patient Active Problem List   Diagnosis Date Noted  . S/P laparoscopic  hysterectomy 02/14/2017   Myles Gip PT, DPT [phone]  11/10/2019, 2:09 PM  [facility] [address] [place], [place], [address] Phone: [phone]   Fax:  [phone]  Name: Ashley Rose MRN: [id number] Date of Birth: [birth year]

## 2019-11-17 ENCOUNTER — Other Ambulatory Visit: Payer: Self-pay

## 2019-11-17 ENCOUNTER — Ambulatory Visit: Payer: Medicaid Other | Admitting: Physical Therapy

## 2019-11-17 ENCOUNTER — Encounter: Payer: Self-pay | Admitting: Physical Therapy

## 2019-11-17 DIAGNOSIS — R262 Difficulty in walking, not elsewhere classified: Secondary | ICD-10-CM

## 2019-11-17 DIAGNOSIS — R2681 Unsteadiness on feet: Secondary | ICD-10-CM

## 2019-11-17 DIAGNOSIS — M6281 Muscle weakness (generalized): Secondary | ICD-10-CM

## 2019-11-17 NOTE — Therapy (Signed)
[facility] [address] [place], [place], [address] Phone: [phone]   Fax:  [phone]  Physical Therapy Treatment  Patient Details  Name: Ashley Rose MRN: [id number] Date of Birth: [birth year] Referring Provider (PT): Durenda Hurt   Encounter Date: 11/17/2019   PT End of Session - 11/17/19 1317    Visit Number 16    Number of Visits 24    Date for PT Re-Evaluation 12/24/19    Authorization Type [employer]    Authorization - Visit Number 3    Authorization - Number of Visits 14    PT Start Time 7169    PT Stop Time 6789    PT Time Calculation (min) 43 min    Equipment Utilized During Treatment Gait belt    Activity Tolerance Patient tolerated treatment well    Behavior During Therapy WFL for tasks assessed/performed           Past Medical History:  Diagnosis Date   ADHD (attention deficit hyperactivity disorder)    DVT (deep venous thrombosis) (HCC)    Right leg   Family history of adverse reaction to anesthesia    grandmother had a hard time waking up   Family history of ovarian cancer    GERD (gastroesophageal reflux disease)    Headache    migraines   History of kidney stones    Hypercholesteremia    Hypertension    Seizures ([place])    last seizure at age 67    Past Surgical History:  Procedure Laterality Date   CHOLECYSTECTOMY  2004   COLPOSCOPY  01/2014   COLPOSCOPY  03/2014   CYSTOSCOPY N/A 02/14/2017   Procedure: CYSTOSCOPY;  Surgeon: Malachy Mood, MD;  Location: [facility];  Service: Gynecology;  Laterality: N/A;   DILATION AND CURETTAGE OF UTERUS  2015   LAPAROSCOPIC HYSTERECTOMY Bilateral 02/14/2017   Procedure: HYSTERECTOMY TOTAL LAPAROSCOPIC BILATERAL SALPINGECTOMY;  Surgeon: Malachy Mood, MD;  Location: [facility];  Service: Gynecology;  Laterality: Bilateral;   LEG SURGERY Bilateral    as a child to correct walking    TONSILLECTOMY      There were no  vitals filed for this visit.   Subjective Assessment - 11/17/19 1315    Subjective Patient notes that she is fatigued today and is still trying to secure housing prior to the end of the month. Patient also notes some increased stress 2/2 to family dynamics. Does not report any falls.    Currently in Pain? No/denies          TREATMENT  Therapeutic Exercise: NuStep, L5-10, spm>60, seat 8, progressively loaded/resisted intervals of 1-2 min x10 min total for improved tolerance to L knee flexion and endurance  Neuromuscular Re-education: Resisted walking in // bars: lateral, forward, backward for 15 minutes total, Mod I, faded UE support, no LOB Hurdle walking in // bars: lateral, forward, backward for 15 minutes total, Mod I, faded UE support, no LOB   Patient educated throughout session on appropriate technique and form using multi-modal cueing, HEP, and activity modification. Patient articulated understanding and returned demonstration.   ASSESSMENT Patient presents to clinic with excellent motivation to participate in therapy. Patient demonstrates deficits in balance, BLE strength, gait. Patient with improved LLE clearance over 6 inch hurdles with minimal compensations and good awareness of compensations during today's session and responded positively to active interventions. Patient will benefit from continued skilled therapeutic intervention to address remaining deficits in balance, BLE strength, gait in  order to increase function and improve overall QOL.    PT Long Term Goals - 10/29/19 1556      PT LONG TERM GOAL #1   Title Patient will improve DGI by at least 3 points in order to demonstrate clinically significant improvement in balance and decreased risk for falls.    Baseline 8/4: 13/24; 9/20: assess at next visit; 9/23: 16/24 (all items performed without AD except step over obstacle)    Time 8    Period Weeks    Status On-going    Target Date 12/24/19      PT LONG TERM GOAL  #2   Title Patient will demonstrate improved function as evidenced by a score of 64 on FOTO measure for full participation in activities at home and in the community.    Baseline 8/4: 43; 9/20: not taken 2/2 to technical difficulties; 9/23: 38 (patient notes answering based on back, not LE)    Time 8    Period Weeks    Status On-going    Target Date 12/24/19      PT LONG TERM GOAL #3   Title Patient will increase 6MWT by at least 42m (137ft) in order to demonstrate clinically significant improvement in cardiopulmonary endurance and community ambulation.    Baseline 8/4: 272 feet, quad cane, CGA/SBA; 9/20: 475.2 ft, QC, SBA/MOD I; 9/23: 590 ft w/ QC, SBA/MOD I    Time 8    Period Weeks    Status On-going    Target Date 12/24/19      PT LONG TERM GOAL #4   Title Patient will be able to walk 0.3 mi/ 6 blocks with LRAD and carrying a load of 6# independently without LOB or unsteadiness in order to return to PLOF.    Baseline IE: 272 feet, quad cane, no external load; 9/20: QC, 0.18 mi, no load    Time 8    Period Weeks    Status On-going    Target Date 12/24/19      PT LONG TERM GOAL #5   Title Patient will decrease 5TSTS by at least 3 seconds without UE support nor compensatory patterns in order to demonstrate clinically significant improvement in LE strength.    Baseline IE: 24.6 sec, no UE support; 9/20: assess at next visit; 9/23: 22.37 sec, no UE support    Time 8    Period Weeks    Status On-going    Target Date 12/24/19                 Plan - 11/17/19 1318    Clinical Impression Statement Patient presents to clinic with excellent motivation to participate in therapy. Patient demonstrates deficits in balance, BLE strength, gait. Patient with improved LLE clearance over 6 inch hurdles with minimal compensations and good awareness of compensations during today's session and responded positively to active interventions. Patient will benefit from continued skilled therapeutic  intervention to address remaining deficits in balance, BLE strength, gait in order to increase function and improve overall QOL.    Personal Factors and Comorbidities Age;Education;Sex;Comorbidity 3+;Fitness;Past/Current Experience;Social Background;Finances;Transportation    Comorbidities ADHD, HTN, hyperlipidemia, migraines, adjustment disorder    Examination-Activity Limitations Bathing;Dressing;Transfers;Bed Mobility;Lift;Squat;Stairs;Locomotion Level;Stand    Examination-Participation Restrictions Dentist;Yard Work;Laundry;Cleaning;Community Activity;Shop;Meal Prep    Stability/Clinical Decision Making Evolving/Moderate complexity    Rehab Potential Good    PT Frequency 1x / week    PT Duration 8 weeks    PT Treatment/Interventions ADLs/Self Care Home Management;Cryotherapy;Electrical Stimulation;Moist Heat;Gait training;Stair training;Functional  mobility training;Therapeutic activities;Neuromuscular re-education;Balance training;Therapeutic exercise;Patient/family education;Orthotic Fit/Training;Scar mobilization;Manual techniques;Dry needling;Splinting;Taping;Joint Manipulations;Spinal Manipulations;Passive range of motion    PT Next Visit Plan 0.25 mi gait w/ QC, BLE strength progression    PT Home Exercise Plan Continue with gait in home/apartment complex    Consulted and Agree with Plan of Care Patient           Patient will benefit from skilled therapeutic intervention in order to improve the following deficits and impairments:  Abnormal gait, Decreased balance, Decreased endurance, Decreased mobility, Difficulty walking, Hypomobility, Improper body mechanics, Pain, Impaired flexibility, Increased fascial restricitons, Decreased strength, Decreased safety awareness, Decreased coordination, Decreased activity tolerance, Decreased range of motion, Decreased knowledge of precautions  Visit Diagnosis: Difficulty in walking, not elsewhere classified  Muscle weakness  (generalized)  Unsteadiness on feet     Problem List Patient Active Problem List   Diagnosis Date Noted   S/P laparoscopic hysterectomy 02/14/2017   Myles Gip PT, DPT [phone]  11/17/2019, 6:12 PM  [facility] [address] [place], [place], [address] Phone: [phone]   Fax:  [phone]  Name: ALEXIS REBER MRN: [id number] Date of Birth: 12/16/1975

## 2019-11-24 ENCOUNTER — Ambulatory Visit: Payer: Medicaid Other | Admitting: Physical Therapy

## 2019-11-24 ENCOUNTER — Encounter: Payer: Self-pay | Admitting: Physical Therapy

## 2019-11-24 ENCOUNTER — Other Ambulatory Visit: Payer: Self-pay

## 2019-11-24 DIAGNOSIS — R262 Difficulty in walking, not elsewhere classified: Secondary | ICD-10-CM

## 2019-11-24 DIAGNOSIS — M79662 Pain in left lower leg: Secondary | ICD-10-CM

## 2019-11-24 DIAGNOSIS — M6281 Muscle weakness (generalized): Secondary | ICD-10-CM

## 2019-11-24 DIAGNOSIS — R2681 Unsteadiness on feet: Secondary | ICD-10-CM

## 2019-11-24 NOTE — Therapy (Signed)
[facility] [address] [place], [place], [address] Phone: [phone]   Fax:  [phone]  Physical Therapy Treatment  Patient Details  Name: Ashley Rose MRN: [id number] Date of Birth: 1975/09/20 Referring Provider (PT): Durenda Hurt   Encounter Date: 11/24/2019   PT End of Session - 11/24/19 1744    Visit Number 17    Number of Visits 24    Date for PT Re-Evaluation 12/24/19    Authorization Type [employer]    Authorization - Visit Number 4    Authorization - Number of Visits 14    PT Start Time 1300    PT Stop Time 2992    PT Time Calculation (min) 55 min    Equipment Utilized During Treatment Gait belt    Activity Tolerance Patient tolerated treatment well    Behavior During Therapy WFL for tasks assessed/performed           Past Medical History:  Diagnosis Date   ADHD (attention deficit hyperactivity disorder)    DVT (deep venous thrombosis) (HCC)    Right leg   Family history of adverse reaction to anesthesia    grandmother had a hard time waking up   Family history of ovarian cancer    GERD (gastroesophageal reflux disease)    Headache    migraines   History of kidney stones    Hypercholesteremia    Hypertension    Seizures ([place])    last seizure at age 58    Past Surgical History:  Procedure Laterality Date   CHOLECYSTECTOMY  2004   COLPOSCOPY  01/2014   COLPOSCOPY  03/2014   CYSTOSCOPY N/A 02/14/2017   Procedure: CYSTOSCOPY;  Surgeon: Malachy Mood, MD;  Location: [facility];  Service: Gynecology;  Laterality: N/A;   DILATION AND CURETTAGE OF UTERUS  2015   LAPAROSCOPIC HYSTERECTOMY Bilateral 02/14/2017   Procedure: HYSTERECTOMY TOTAL LAPAROSCOPIC BILATERAL SALPINGECTOMY;  Surgeon: Malachy Mood, MD;  Location: [facility];  Service: Gynecology;  Laterality: Bilateral;   LEG SURGERY Bilateral    as a child to correct walking    TONSILLECTOMY      There were no  vitals filed for this visit.   Subjective Assessment - 11/24/19 1742    Subjective Patient states that she was able to go down the steps at her aprtment complex albeit sideways. Patient also reports that she continues to try to secure housing for herself and fiance. Patient was able to walk one block to do some basic shopping and return home with a little bit of difficulty 2/2 to waiting in standing.    Currently in Pain? No/denies           TREATMENT  Therapeutic Exercise: Sit to stand with 5# dumb bell reach, 3x10 6" step ups, 2x10, BLE, BUE support  Neuromuscular Re-education: Gait outside clinic, QC, SBA/CGA, with varied surface exposure, no LOB. Patient negotiating inclines and sidewalk with limited unsteadiness. 4x 0.09 mi continuous (Lap 1: 5 min 11 s, Lap 2: 5 min 1 sec, Lap 3: 4 min 35 sec, Lap 4 5 min)   Patient educated throughout session on appropriate technique and form using multi-modal cueing, HEP, and activity modification. Patient articulated understanding and returned demonstration.   ASSESSMENT Patient presents to clinic with excellent motivation to participate in therapy. Patient demonstrates deficits in balance, BLE strength, gait. Patient able to walk 0.36 mi continuously without standing nor sitting rest break during today's session and responded positively to active  interventions. Patient will benefit from continued skilled therapeutic intervention to address remaining deficits in balance, BLE strength, gait in order to increase function and improve overall QOL.     PT Long Term Goals - 10/29/19 1556      PT LONG TERM GOAL #1   Title Patient will improve DGI by at least 3 points in order to demonstrate clinically significant improvement in balance and decreased risk for falls.    Baseline 8/4: 13/24; 9/20: assess at next visit; 9/23: 16/24 (all items performed without AD except step over obstacle)    Time 8    Period Weeks    Status On-going    Target Date  12/24/19      PT LONG TERM GOAL #2   Title Patient will demonstrate improved function as evidenced by a score of 64 on FOTO measure for full participation in activities at home and in the community.    Baseline 8/4: 43; 9/20: not taken 2/2 to technical difficulties; 9/23: 38 (patient notes answering based on back, not LE)    Time 8    Period Weeks    Status On-going    Target Date 12/24/19      PT LONG TERM GOAL #3   Title Patient will increase 6MWT by at least 75m (163ft) in order to demonstrate clinically significant improvement in cardiopulmonary endurance and community ambulation.    Baseline 8/4: 272 feet, quad cane, CGA/SBA; 9/20: 475.2 ft, QC, SBA/MOD I; 9/23: 590 ft w/ QC, SBA/MOD I    Time 8    Period Weeks    Status On-going    Target Date 12/24/19      PT LONG TERM GOAL #4   Title Patient will be able to walk 0.3 mi/ 6 blocks with LRAD and carrying a load of 6# independently without LOB or unsteadiness in order to return to PLOF.    Baseline IE: 272 feet, quad cane, no external load; 9/20: QC, 0.18 mi, no load    Time 8    Period Weeks    Status On-going    Target Date 12/24/19      PT LONG TERM GOAL #5   Title Patient will decrease 5TSTS by at least 3 seconds without UE support nor compensatory patterns in order to demonstrate clinically significant improvement in LE strength.    Baseline IE: 24.6 sec, no UE support; 9/20: assess at next visit; 9/23: 22.37 sec, no UE support    Time 8    Period Weeks    Status On-going    Target Date 12/24/19                 Plan - 11/24/19 1744    Clinical Impression Statement Patient presents to clinic with excellent motivation to participate in therapy. Patient demonstrates deficits in balance, BLE strength, gait. Patient able to walk 0.36 mi continuously without standing nor sitting rest break during today's session and responded positively to active interventions. Patient will benefit from continued skilled therapeutic  intervention to address remaining deficits in balance, BLE strength, gait in order to increase function and improve overall QOL.    Personal Factors and Comorbidities Age;Education;Sex;Comorbidity 3+;Fitness;Past/Current Experience;Social Background;Finances;Transportation    Comorbidities ADHD, HTN, hyperlipidemia, migraines, adjustment disorder    Examination-Activity Limitations Bathing;Dressing;Transfers;Bed Mobility;Lift;Squat;Stairs;Locomotion Level;Stand    Examination-Participation Restrictions Dentist;Yard Work;Laundry;Cleaning;Community Activity;Shop;Meal Prep    Stability/Clinical Decision Making Evolving/Moderate complexity    Rehab Potential Good    PT Frequency 1x / week    PT  Duration 8 weeks    PT Treatment/Interventions ADLs/Self Care Home Management;Cryotherapy;Electrical Stimulation;Moist Heat;Gait training;Stair training;Functional mobility training;Therapeutic activities;Neuromuscular re-education;Balance training;Therapeutic exercise;Patient/family education;Orthotic Fit/Training;Scar mobilization;Manual techniques;Dry needling;Splinting;Taping;Joint Manipulations;Spinal Manipulations;Passive range of motion    PT Next Visit Plan 0.25 mi gait w/ QC, BLE strength progression    PT Home Exercise Plan Continue with gait in home/apartment complex    Consulted and Agree with Plan of Care Patient           Patient will benefit from skilled therapeutic intervention in order to improve the following deficits and impairments:  Abnormal gait, Decreased balance, Decreased endurance, Decreased mobility, Difficulty walking, Hypomobility, Improper body mechanics, Pain, Impaired flexibility, Increased fascial restricitons, Decreased strength, Decreased safety awareness, Decreased coordination, Decreased activity tolerance, Decreased range of motion, Decreased knowledge of precautions  Visit Diagnosis: Difficulty in walking, not elsewhere classified  Muscle weakness  (generalized)  Unsteadiness on feet  Pain in left lower leg     Problem List Patient Active Problem List   Diagnosis Date Noted   S/P laparoscopic hysterectomy 02/14/2017   Myles Gip PT, DPT [phone]  11/24/2019, 5:49 PM  [facility] [address] [place], [place], [address] Phone: [phone]   Fax:  [phone]  Name: AARON BOEH MRN: [id number] Date of Birth: April 09, 1975

## 2019-12-01 ENCOUNTER — Ambulatory Visit: Payer: Medicaid Other | Admitting: Physical Therapy

## 2019-12-08 ENCOUNTER — Encounter: Payer: Medicaid Other | Admitting: Physical Therapy

## 2019-12-15 ENCOUNTER — Encounter: Payer: Medicaid Other | Admitting: Physical Therapy

## 2019-12-22 ENCOUNTER — Encounter: Payer: Medicaid Other | Admitting: Physical Therapy

## 2019-12-29 ENCOUNTER — Encounter: Payer: Medicaid Other | Admitting: Physical Therapy

## 2020-01-05 ENCOUNTER — Encounter: Payer: Medicaid Other | Admitting: Physical Therapy

## 2020-01-12 ENCOUNTER — Encounter: Payer: Medicaid Other | Admitting: Physical Therapy

## 2020-01-19 ENCOUNTER — Encounter: Payer: Medicaid Other | Admitting: Physical Therapy

## 2020-01-26 ENCOUNTER — Encounter: Payer: Medicaid Other | Admitting: Physical Therapy
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
